# Patient Record
Sex: Male | Born: 1975 | Race: White | Hispanic: No | Marital: Married | Smoking: Never smoker
Health system: Southern US, Community
[De-identification: ages and names within clinical notes are randomized; demographics above are authoritative.]

## PROBLEM LIST (undated history)

## (undated) VITALS — BP 128/76 | HR 78 | Temp 98.4°F | Resp 20

## (undated) DIAGNOSIS — F32A Depression, unspecified: Secondary | ICD-10-CM

## (undated) DIAGNOSIS — Z87442 Personal history of urinary calculi: Secondary | ICD-10-CM

## (undated) DIAGNOSIS — Z973 Presence of spectacles and contact lenses: Secondary | ICD-10-CM

## (undated) DIAGNOSIS — I10 Essential (primary) hypertension: Secondary | ICD-10-CM

## (undated) DIAGNOSIS — D66 Hereditary factor VIII deficiency: Secondary | ICD-10-CM

## (undated) DIAGNOSIS — N200 Calculus of kidney: Secondary | ICD-10-CM

## (undated) DIAGNOSIS — S43006A Unspecified dislocation of unspecified shoulder joint, initial encounter: Secondary | ICD-10-CM

## (undated) DIAGNOSIS — E119 Type 2 diabetes mellitus without complications: Secondary | ICD-10-CM

## (undated) DIAGNOSIS — F419 Anxiety disorder, unspecified: Secondary | ICD-10-CM

## (undated) HISTORY — PX: HERNIA REPAIR: SHX51

## (undated) HISTORY — DX: Calculus of kidney: N20.0

---

## 2009-05-07 ENCOUNTER — Ambulatory Visit: Payer: Self-pay | Admitting: Diagnostic Radiology

## 2009-05-07 ENCOUNTER — Emergency Department (HOSPITAL_BASED_OUTPATIENT_CLINIC_OR_DEPARTMENT_OTHER): Admission: EM | Admit: 2009-05-07 | Discharge: 2009-05-07 | Payer: Self-pay | Admitting: Emergency Medicine

## 2009-05-11 ENCOUNTER — Ambulatory Visit: Payer: Self-pay | Admitting: Diagnostic Radiology

## 2009-05-11 ENCOUNTER — Emergency Department (HOSPITAL_BASED_OUTPATIENT_CLINIC_OR_DEPARTMENT_OTHER): Admission: EM | Admit: 2009-05-11 | Discharge: 2009-05-11 | Payer: Self-pay | Admitting: Emergency Medicine

## 2010-03-06 ENCOUNTER — Ambulatory Visit: Payer: Self-pay | Admitting: Diagnostic Radiology

## 2010-03-06 ENCOUNTER — Emergency Department (HOSPITAL_BASED_OUTPATIENT_CLINIC_OR_DEPARTMENT_OTHER): Admission: EM | Admit: 2010-03-06 | Discharge: 2010-03-06 | Payer: Self-pay | Admitting: Emergency Medicine

## 2010-04-07 ENCOUNTER — Emergency Department (HOSPITAL_BASED_OUTPATIENT_CLINIC_OR_DEPARTMENT_OTHER): Admission: EM | Admit: 2010-04-07 | Discharge: 2010-04-07 | Payer: Self-pay | Admitting: Emergency Medicine

## 2010-08-10 LAB — URINALYSIS, ROUTINE W REFLEX MICROSCOPIC
Ketones, ur: NEGATIVE mg/dL
Nitrite: NEGATIVE
Protein, ur: NEGATIVE mg/dL
Urobilinogen, UA: 1 mg/dL (ref 0.0–1.0)
pH: 7.5 (ref 5.0–8.0)

## 2010-08-12 LAB — URINALYSIS, ROUTINE W REFLEX MICROSCOPIC
Bilirubin Urine: NEGATIVE
Hgb urine dipstick: NEGATIVE
Ketones, ur: NEGATIVE mg/dL
Nitrite: NEGATIVE
Protein, ur: NEGATIVE mg/dL
Urobilinogen, UA: 1 mg/dL (ref 0.0–1.0)

## 2010-08-31 LAB — URINE MICROSCOPIC-ADD ON

## 2010-08-31 LAB — DIFFERENTIAL
Basophils Absolute: 0 10*3/uL (ref 0.0–0.1)
Basophils Relative: 1 % (ref 0–1)
Lymphocytes Relative: 32 % (ref 12–46)
Monocytes Absolute: 1 10*3/uL (ref 0.1–1.0)
Neutro Abs: 4.6 10*3/uL (ref 1.7–7.7)
Neutrophils Relative %: 52 % (ref 43–77)

## 2010-08-31 LAB — CBC
Hemoglobin: 14.2 g/dL (ref 13.0–17.0)
MCHC: 34.1 g/dL (ref 30.0–36.0)
RDW: 11.2 % — ABNORMAL LOW (ref 11.5–15.5)

## 2010-08-31 LAB — BASIC METABOLIC PANEL
CO2: 31 mEq/L (ref 19–32)
Calcium: 9.3 mg/dL (ref 8.4–10.5)
Creatinine, Ser: 1.1 mg/dL (ref 0.4–1.5)
GFR calc Af Amer: >60 mL/min (ref >60)
GFR calc non Af Amer: >60 mL/min (ref >60)
Glucose, Bld: 109 mg/dL — ABNORMAL HIGH (ref 70–99)
Sodium: 147 mEq/L — ABNORMAL HIGH (ref 135–145)

## 2010-08-31 LAB — URINALYSIS, ROUTINE W REFLEX MICROSCOPIC
Glucose, UA: NEGATIVE mg/dL
Nitrite: NEGATIVE
Protein, ur: 100 mg/dL — AB
Specific Gravity, Urine: 1.02 (ref 1.005–1.030)
Urobilinogen, UA: 0.2 mg/dL (ref 0.0–1.0)
Urobilinogen, UA: 1 mg/dL (ref 0.0–1.0)
pH: 5.5 (ref 5.0–8.0)

## 2010-08-31 LAB — URINE CULTURE: Colony Count: 8000

## 2010-12-18 ENCOUNTER — Emergency Department (HOSPITAL_BASED_OUTPATIENT_CLINIC_OR_DEPARTMENT_OTHER): Admission: EM | Admit: 2010-12-18 | Discharge: 2010-12-18 | Discharge status: Against Medical Advice | Payer: Self-pay

## 2010-12-23 ENCOUNTER — Emergency Department (HOSPITAL_BASED_OUTPATIENT_CLINIC_OR_DEPARTMENT_OTHER)
Admission: EM | Admit: 2010-12-23 | Discharge: 2010-12-23 | Disposition: A | Discharge status: Home / Self Care | Payer: Self-pay | Attending: Emergency Medicine | Admitting: Emergency Medicine

## 2010-12-23 ENCOUNTER — Encounter: Payer: Self-pay | Admitting: *Deleted

## 2010-12-23 DIAGNOSIS — IMO0001 Reserved for inherently not codable concepts without codable children: Secondary | ICD-10-CM | POA: Insufficient documentation

## 2010-12-23 DIAGNOSIS — H05229 Edema of unspecified orbit: Secondary | ICD-10-CM | POA: Insufficient documentation

## 2010-12-23 DIAGNOSIS — T7840XA Allergy, unspecified, initial encounter: Secondary | ICD-10-CM

## 2010-12-23 MED ORDER — PREDNISONE 10 MG PO TABS: 10.0000 mg | ORAL_TABLET | Freq: Two times a day (BID) | ORAL | Status: AC

## 2010-12-23 MED ORDER — FAMOTIDINE 10 MG PO TABS: ORAL_TABLET | ORAL | Status: DC

## 2010-12-23 MED ORDER — PREDNISONE 20 MG PO TABS
60.0000 mg | ORAL_TABLET | Freq: Once | ORAL | Status: AC
  Administered 2010-12-23: 60 mg via ORAL
  Filled 2010-12-23: qty 3

## 2010-12-23 MED ORDER — FAMOTIDINE 20 MG PO TABS
20.0000 mg | ORAL_TABLET | Freq: Once | ORAL | Status: AC
  Administered 2010-12-23: 20 mg via ORAL
  Filled 2010-12-23: qty 1

## 2010-12-23 NOTE — ED Notes (Signed)
Pt c/o eye swelling and redness around eyes x 1 day. Pt also c/o SOB

## 2010-12-23 NOTE — ED Provider Notes (Signed)
History     Chief Complaint  Patient presents with  . Allergic Reaction   HPI Pt woke this am w/ bilateral periorbital edema.  No vision changes.  No trauma.  No headache.  No known allergen and has not started any new medications.  Denies hives, throat tightness, tongue and lip edema.  Has an insect bite on left forearm that he noticed this am.  Had periorbital edema once in the past after being bitten by a spider.   History reviewed. No pertinent past medical history.  Past Surgical History  Procedure Date  . Hernia repair     History reviewed. No pertinent family history.  History  Substance Use Topics  . Smoking status: Never Smoker   . Smokeless tobacco: Not on file  . Alcohol Use: No      Review of Systems  All other systems reviewed and are negative.    Physical Exam  BP 147/94  Pulse 102  Temp(Src) 98.7 F (37.1 C) (Oral)  Resp 16  Wt 240 lb (108.863 kg)  SpO2 100%  Physical Exam  Nursing note and vitals reviewed. Constitutional: He is oriented to person, place, and time. He appears well-developed and well-nourished. No distress.  HENT:  Head: Normocephalic and atraumatic.  Mouth/Throat: Mucous membranes are normal. No posterior oropharyngeal edema.       No lip or tongue edema.  Eyes: Conjunctivae and EOM are normal. Pupils are equal, round, and reactive to light. Right eye exhibits no chemosis and no discharge.       Bilateral periorbital edema and mild erythema.  Non-tender.    Neck: Normal range of motion.  Cardiovascular: Normal rate and regular rhythm.   Pulmonary/Chest: Effort normal and breath sounds normal. No stridor. No respiratory distress.  Neurological: He is alert and oriented to person, place, and time.  Skin: Skin is warm and dry. No rash noted.       Macular, erythematous lesion on left medial forearm w/ mild induration and tiny bite marks in center.  Appears to be an inflamed insect bite.   Psychiatric: He has a normal mood and  affect. His behavior is normal.    ED Course  Procedures  MDM Pt woke w/ periorbital edema.  Has an insect bite on left forearm and had a similar reaction to spider bite in the past.  Symptoms have improved w/ benadryl at home.  Based on acute onset of symptoms, most likely has an allergic reaction.  No signs of respiratory compromise on exam. Received prednisone and pepcid in ED and discharged home w/ same.  Return precautions discussed.        Otilio Miu, [place] 12/23/10 [phone]

## 2010-12-23 NOTE — ED Provider Notes (Signed)
   Joaquin Bend and discussed his management with PAC [place].  I agree with the history, physical, assessment, and plan of care, with the following exceptions: None   Morad Tal Y.    Gavin Pound. Oletta Lamas, MD 12/23/10 [id number]

## 2011-01-22 NOTE — ED Provider Notes (Signed)
Evaluation and management procedures were performed by the mid-level provider (PA/NP/CNM) under my supervision/collaboration. I was present and available during the ED course. Imran Nuon Y.   Gavin Pound. Oletta Lamas, MD 01/22/11 2107

## 2011-04-23 IMAGING — CT CT ABD-PELV W/O CM
2 of 4 series · 17 of 46 positions shown, 19 images · non-contrast
Comparison: 05/07/2009.

CLINICAL DATA: Bilateral flank pain.

CT ABDOMEN AND PELVIS WITHOUT CONTRAST
TECHNIQUE: Multidetector CT imaging of the abdomen and pelvis was
performed following the standard protocol without intravenous
contrast.

[Series 2: renal stone < 200 lbs 5.0 b31f · axial · 0.77mm/px · z∈[-402,-32]mm · 14 of 82 slices shown, 16 images]
[im 4/82  soft-tissue]
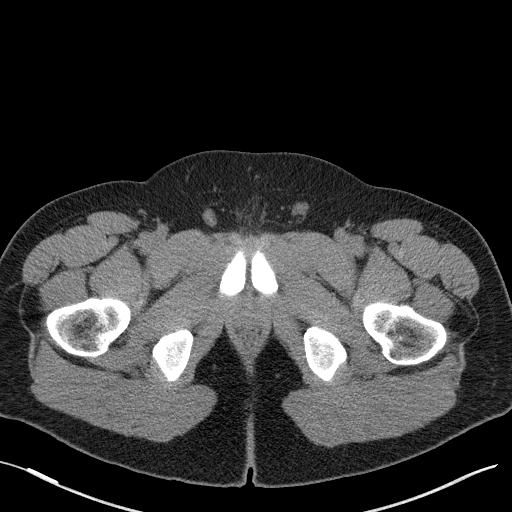
[im 4/82  bone]
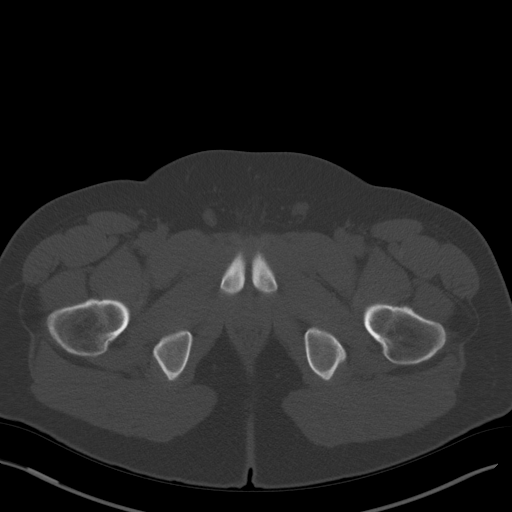
[im 10/82  soft-tissue]
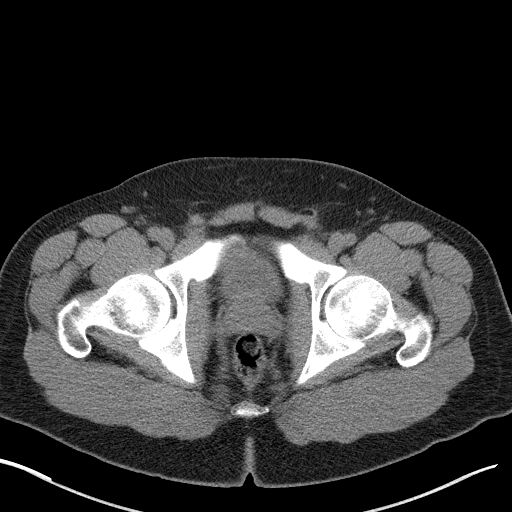
[im 16/82  soft-tissue]
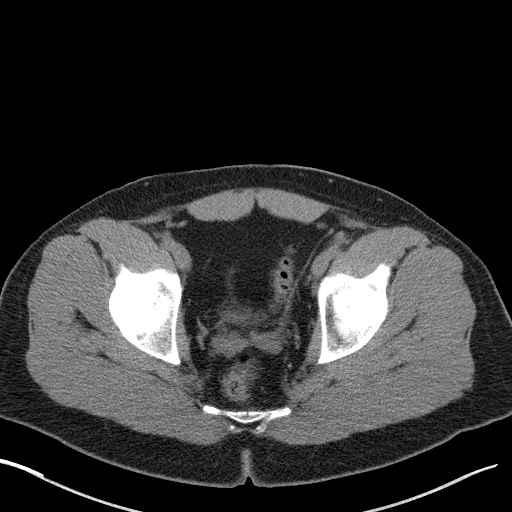
[im 22/82  soft-tissue]
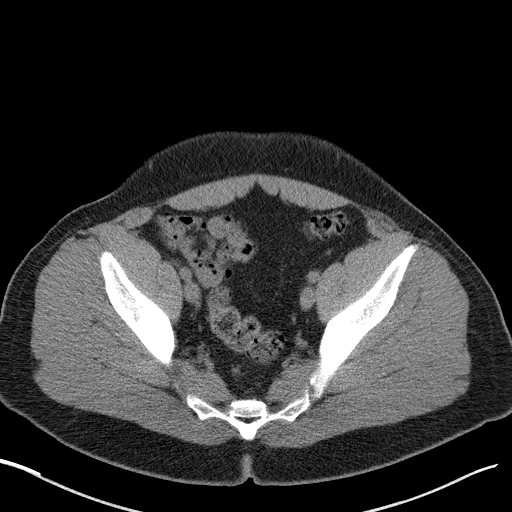
[im 29/82  soft-tissue]
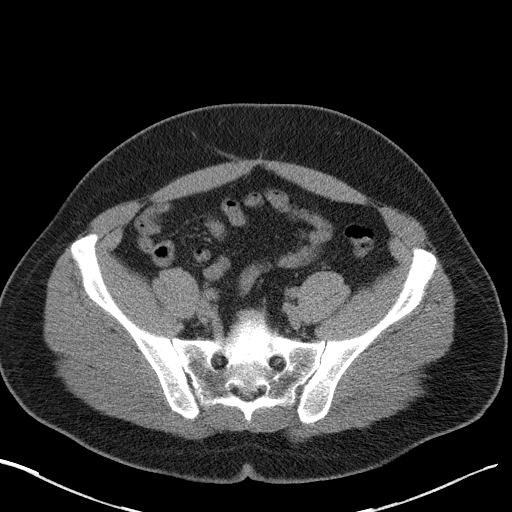
[im 32/82  soft-tissue]
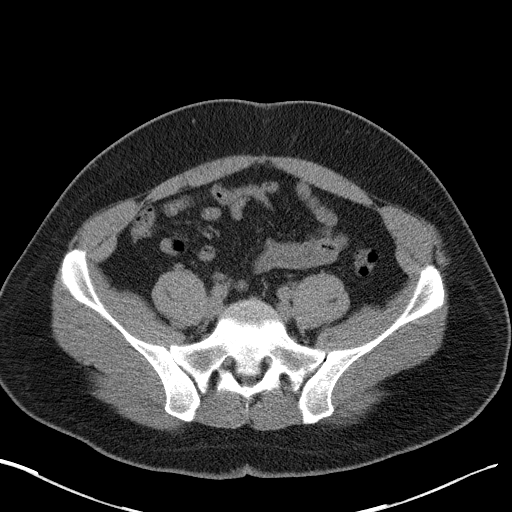
[im 38/82  soft-tissue]
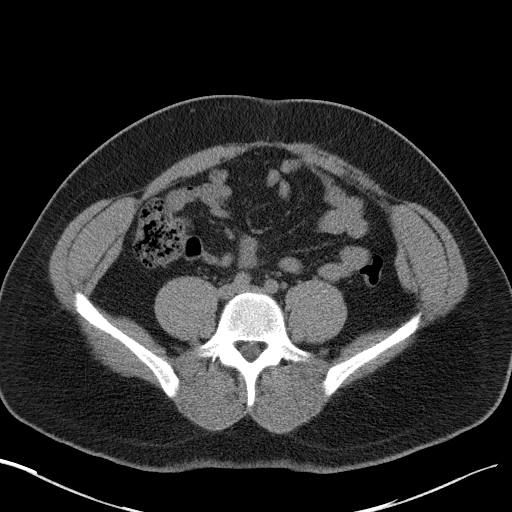
[im 44/82  soft-tissue]
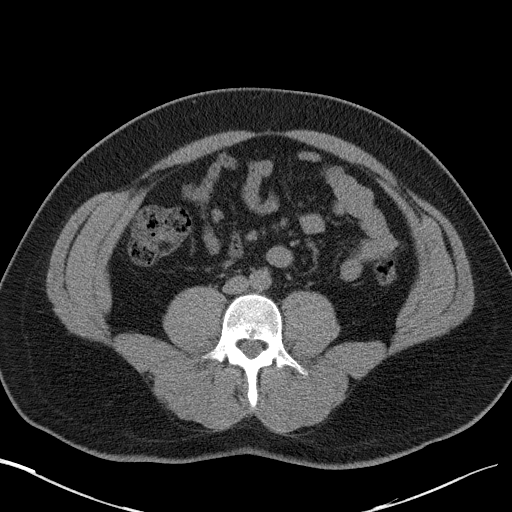
[im 50/82  soft-tissue]
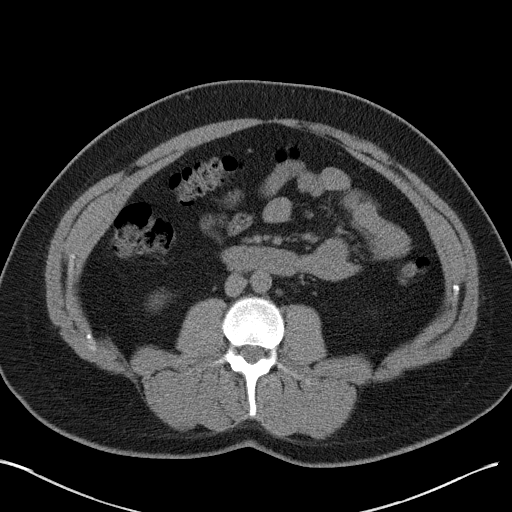
[im 50/82  bone]
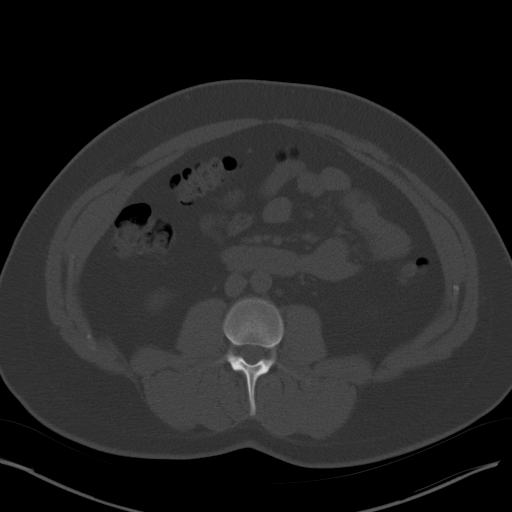
[im 53/82  soft-tissue]
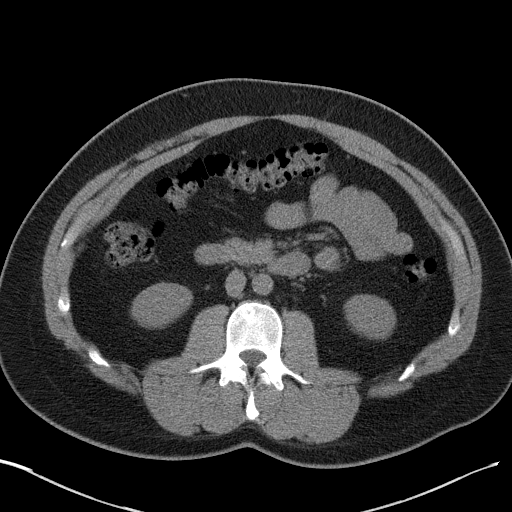
[im 60/82  soft-tissue]
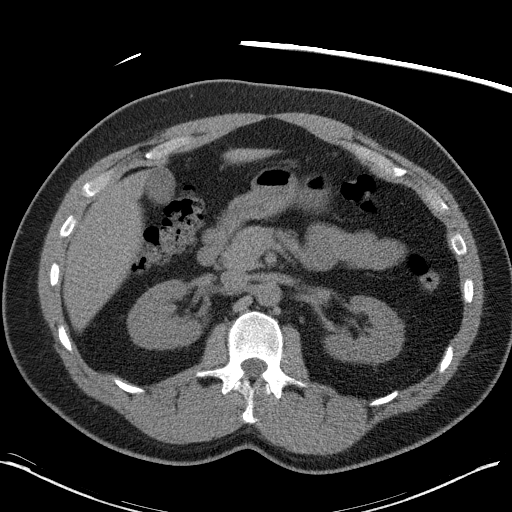
[im 66/82  soft-tissue]
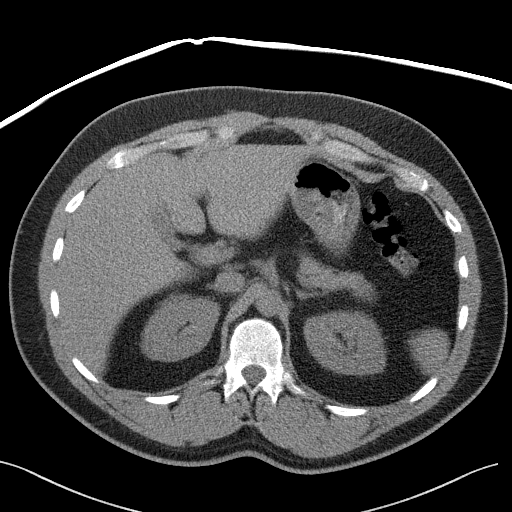
[im 72/82  soft-tissue]
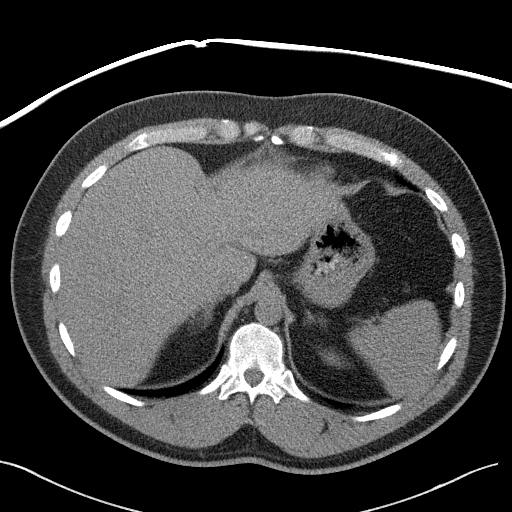
[im 78/82  soft-tissue]
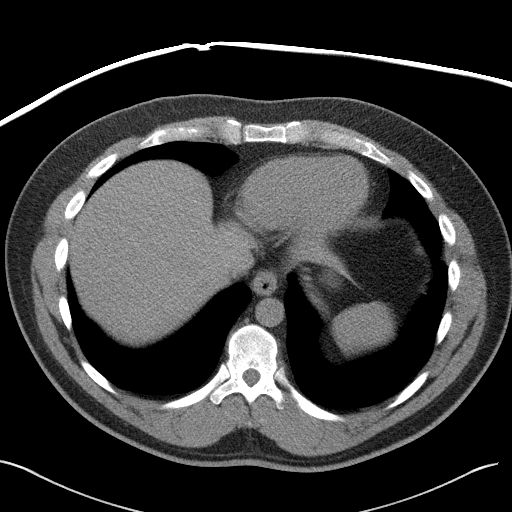

[Series 5: renal stone 3.0 coronal · coronal · 0.80mm/px · 3 of 100 slices shown]
[im 34/100  soft-tissue]
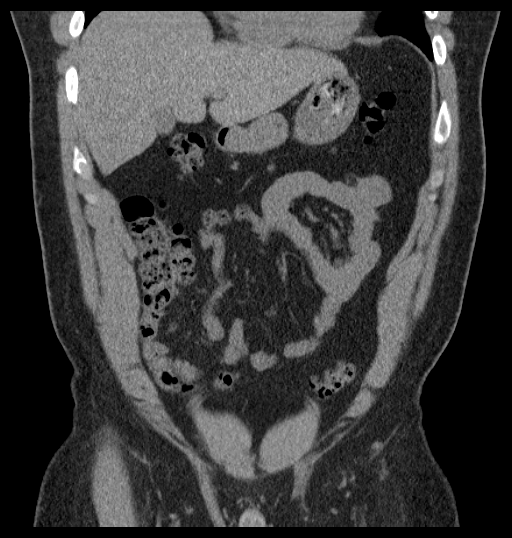
[im 45/100  soft-tissue]
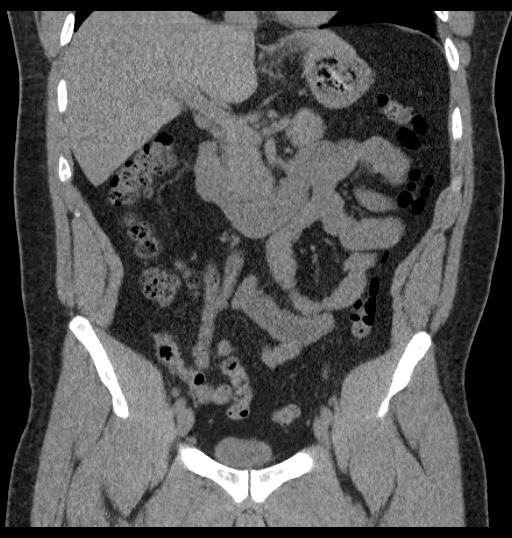
[im 56/100  soft-tissue]
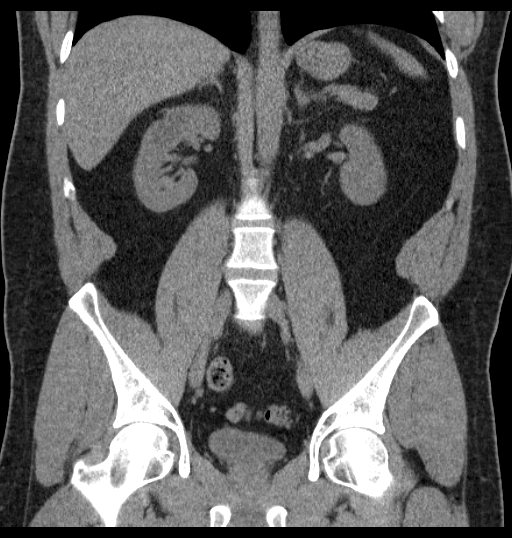

[17 of 46 positions shown; findings below may reference images not displayed]

FINDINGS: No focal abnormalities seen in the liver or spleen on
this study performed without intravenous contrast material.  The
stomach, duodenum, pancreas, gallbladder, adrenal glands, and left
kidney have normal imaging features.  2.3 cm subtle low density
region in the right kidney was present on the previous study and is
not appear substantially changed in the interval.  There is no
renal stone on either side.  No hydronephrosis.  No evidence for
ureteral or bladder calculi.

No abdominal aortic aneurysm.  There is no lymphadenopathy in the
abdomen.  No evidence for free fluid in the abdomen.

Imaging through the pelvis shows no evidence for pelvic
lymphadenopathy.  No free fluid is seen in the pelvis.  No
substantial divert jugular changes in the colon.  No colonic
diverticulitis.  The terminal ileum and the appendix are normal.

Bone windows reveal no worrisome lytic or sclerotic osseous
lesions.
IMPRESSION: No acute findings in the abdomen or pelvis to explain this
patient's history of bilateral flank pain.

2.3 cm subtle low density lesion in the right kidney does not
appear substantially changed since the prior study of almost 1 year
ago.  This is probably a renal cyst given the interval stability.
An area of parenchymal scarring could also generate this
appearance.

## 2011-04-26 ENCOUNTER — Encounter (HOSPITAL_BASED_OUTPATIENT_CLINIC_OR_DEPARTMENT_OTHER): Payer: Self-pay | Admitting: Emergency Medicine

## 2011-04-26 ENCOUNTER — Emergency Department (HOSPITAL_BASED_OUTPATIENT_CLINIC_OR_DEPARTMENT_OTHER)
Admission: EM | Admit: 2011-04-26 | Discharge: 2011-04-27 | Disposition: A | Discharge status: Home / Self Care | Payer: BC Managed Care – PPO | Attending: Emergency Medicine | Admitting: Emergency Medicine

## 2011-04-26 DIAGNOSIS — D66 Hereditary factor VIII deficiency: Secondary | ICD-10-CM | POA: Insufficient documentation

## 2011-04-26 DIAGNOSIS — K068 Other specified disorders of gingiva and edentulous alveolar ridge: Secondary | ICD-10-CM

## 2011-04-26 DIAGNOSIS — K055 Other periodontal diseases: Secondary | ICD-10-CM | POA: Insufficient documentation

## 2011-04-26 HISTORY — DX: Hereditary factor VIII deficiency: D66

## 2011-04-26 LAB — CBC
HCT: 40 % (ref 39.0–52.0)
MCH: 32.1 pg (ref 26.0–34.0)
MCHC: 33.5 g/dL (ref 30.0–36.0)
MCV: 95.9 fL (ref 78.0–100.0)
Platelets: 266 10*3/uL (ref 150–400)
RDW: 11.5 % (ref 11.5–15.5)
WBC: 9.8 10*3/uL (ref 4.0–10.5)

## 2011-04-26 LAB — PROTIME-INR: Prothrombin Time: 13.2 seconds (ref 11.6–15.2)

## 2011-04-26 LAB — DIFFERENTIAL
Basophils Absolute: 0 10*3/uL (ref 0.0–0.1)
Eosinophils Absolute: 0.2 10*3/uL (ref 0.0–0.7)
Eosinophils Relative: 2 % (ref 0–5)
Lymphocytes Relative: 20 % (ref 12–46)
Monocytes Absolute: 0.9 10*3/uL (ref 0.1–1.0)

## 2011-04-26 LAB — BASIC METABOLIC PANEL
CO2: 26 mEq/L (ref 19–32)
Calcium: 9.4 mg/dL (ref 8.4–10.5)
Creatinine, Ser: 0.9 mg/dL (ref 0.50–1.35)
GFR calc non Af Amer: >90 mL/min (ref >90)

## 2011-04-26 MED ORDER — HYDROCODONE-ACETAMINOPHEN 5-325 MG PO TABS
2.0000 | ORAL_TABLET | Freq: Once | ORAL | Status: AC
  Administered 2011-04-26: 2 via ORAL
  Filled 2011-04-26: qty 2

## 2011-04-26 MED ORDER — HYDROMORPHONE HCL PF 2 MG/ML IJ SOLN
2.0000 mg | Freq: Once | INTRAMUSCULAR | Status: AC
  Administered 2011-04-26: 2 mg via INTRAMUSCULAR
  Filled 2011-04-26: qty 1

## 2011-04-26 MED ORDER — ONDANSETRON HCL 4 MG/2ML IJ SOLN
4.0000 mg | Freq: Once | INTRAMUSCULAR | Status: AC
  Administered 2011-04-26: 4 mg via INTRAVENOUS

## 2011-04-26 MED ORDER — ONDANSETRON HCL 4 MG/2ML IJ SOLN
8.0000 mg | Freq: Once | INTRAMUSCULAR | Status: DC
  Filled 2011-04-26: qty 2

## 2011-04-26 MED ORDER — "THROMBI-PAD 3""X3"" EX PADS"
1.0000 | MEDICATED_PAD | Freq: Once | CUTANEOUS | Status: AC
  Administered 2011-04-26: 2 via TOPICAL
  Filled 2011-04-26 (×2): qty 1

## 2011-04-26 NOTE — ED Notes (Signed)
Report called to [employer] in charge in ED.  Was told by RN Aurther Loft in charge that she is backed up in ED and 20 people waiting.  RN Aurther Loft said to make [facility] Aware of the wait.  Pt. Has accepting physician  Dr. Ignacia Palma and hematologist also.  Will report to RN Elnita Maxwell. In charge at [facility] and to [facility].

## 2011-04-26 NOTE — ED Notes (Signed)
Family at bedside. 

## 2011-04-26 NOTE — ED Notes (Signed)
Pt reports having teeth cleaned at dentist this morning and has had bleeding from gums and pain since.

## 2011-04-26 NOTE — ED Notes (Signed)
Placed Pt. On 2 liters of oxygen for comfort due to mouth full of blood at times.  Pt. Using suction on his own.

## 2011-04-26 NOTE — ED Notes (Signed)
Pt is noted to be spitting blood tinged saliva. No blood clots noticed.

## 2011-04-26 NOTE — ED Notes (Signed)
No factor 8 in [facility] ED

## 2011-04-26 NOTE — ED Provider Notes (Signed)
History     CSN: [id number] Arrival date & time: 04/26/2011  7:41 PM   First MD Initiated Contact with Patient 04/26/11 1955      Chief Complaint  Patient presents with  . Dental Pain    (Consider location/radiation/quality/duration/timing/severity/associated sxs/prior treatment) HPI Comments: Patient has history hemophilia and had his teeth cleaned that today.  He said extensive gingival bleeding for the past 6 hours.  He believes it is sometimes scraping as well as cleaning. He's been spitting up blood into a basin.  He denies any vomiting, hemoptysis, chest pain, shortness of breath. Denies any difficulty breathing or swallowing.  The history is provided by the patient.    Past Medical History  Diagnosis Date  . Hemophilia     Past Surgical History  Procedure Date  . Hernia repair     No family history on file.  History  Substance Use Topics  . Smoking status: Never Smoker   . Smokeless tobacco: Not on file  . Alcohol Use: No      Review of Systems  All other systems reviewed and are negative.    Allergies  Review of patient's allergies indicates no known allergies.  Home Medications   Current Outpatient Rx  Name Route Sig Dispense Refill  . IBUPROFEN 200 MG PO TABS Oral Take 400 mg by mouth every 6 (six) hours as needed. For pain     . VITAMIN C 500 MG PO TABS Oral Take 500 mg by mouth daily.      Marland Kitchen FAMOTIDINE 10 MG PO TABS  One po bid x 3 days and then as needed 10 tablet 0    BP 126/102  Pulse 81  Temp(Src) 97.9 F (36.6 C) (Oral)  Resp 16  SpO2 100%  Physical Exam  Constitutional: He is oriented to person, place, and time. He appears well-developed and well-nourished. No distress.  HENT:  Head: Normocephalic and atraumatic.  Mouth/Throat: Oropharynx is clear and moist.       Oozing from all gingival surfaces without identifiable specific source.    Eyes: Conjunctivae are normal. Pupils are equal, round, and reactive to light.  Neck:  Normal range of motion.  Cardiovascular: Normal rate, regular rhythm and normal heart sounds.   Pulmonary/Chest: Effort normal and breath sounds normal. No respiratory distress.  Abdominal: Soft. There is no tenderness. There is no rebound and no guarding.  Musculoskeletal: Normal range of motion.  Neurological: He is alert and oriented to person, place, and time. No cranial nerve deficit.  Skin: Skin is warm.    ED Course  Procedures (including critical care time)  Labs Reviewed  CBC - Abnormal; Notable for the following:    RBC 4.17 (*)    All other components within normal limits  BASIC METABOLIC PANEL - Abnormal; Notable for the following:    Glucose, Bld 113 (*)    All other components within normal limits  DIFFERENTIAL  PROTIME-INR  FACTOR 8 ASSAY   No results found.   1. Hemophilia   2. Gingival bleeding       MDM  Gingival bleeding after dental procedure in patient with hemophilia.  Vital signs stable.  Factor VIII is not available from our pharmacy.  We'll attempt to control bleeding with thrombin pad.  Thrombigel is not controlling patient's bleeding.  He remains hemodynamically stable, has a stable hemoglobin and is protecting his airway.  I left a message for on call dentistry, Dr Renard Hamper.  D/w Dr. Renard Hamper who believes bleeding  will improve with factor VIII.  He would like hematologist to manage but will assist if needed.   I discussed case with Dr. Deretha Emory at [facility].  He agrees patient needs factor but it is uncertain whether he will need to be admitted to get it. Will discuss with hematology.  I discussed this case with Dr. Truett Perna of hematology.  He recommends replacing factor VIII to 50% activity and requests transfer to [facility] ED.  He may require input from the coagulation team at [facility].    Patient's airway and vital signs remain stable for transfer.  CRITICAL CARE Performed by: Glynn Octave   Total critical care time: 45  Critical care  time was exclusive of separately billable procedures and treating other patients.  Critical care was necessary to treat or prevent imminent or life-threatening deterioration.  Critical care was time spent personally by me on the following activities: development of treatment plan with patient and/or surrogate as well as nursing, discussions with consultants, evaluation of patient's response to treatment, examination of patient, obtaining history from patient or surrogate, ordering and performing treatments and interventions, ordering and review of laboratory studies, ordering and review of radiographic studies, pulse oximetry and re-evaluation of patient's condition.   Glynn Octave, MD 04/27/11 [phone]

## 2011-04-26 NOTE — ED Notes (Signed)
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 Presents after dental work with diffuse bleeding gums. Was seen at [facility] and aftercare discussed with hematologist Dr. Truett Perna was sent to [facility] long for further care.  Physical exam:  No acute distress, gums diffusely oozing, airway patent, lungs and heart with normal sounds, no rashes, conjunctiva clear  Assessment:  Hemophilia, mild per patient report but with gingival oozing requiring intervention. Sent from outside hospital for further treatment, after discussion with hematologist, the following recommendations have been made:   [facility] Hemophilia service - DDAVP - 0.3 mcg / kg sub Q, vs Amicar solution - 25% solution - 50mg  / kg - swish and swallow.  Factlor 8 Level  Swirl swallow q 6 hours - if works, do for a week.  Can call Sherill back overnight as needed.  0500 - Dr. Myrle Sheng repaged to see pt - has seen and d/w Dr. Marcheta Grammes at [facility] - recommends dose of recombinant factor 8 - I have ordered this.  Then do the Amicar solution q 6 hours X 1 week.  Dr. Myrle Sheng will recheck in a couple of hours.  Recommends against admission to this hospital but states that may need transfer if factor 8 doesn't work.  Change of shift - care signed out to Dr. Patrica Duel.  Vida Roller, MD 04/27/11 [phone]

## 2011-04-27 DIAGNOSIS — D66 Hereditary factor VIII deficiency: Secondary | ICD-10-CM

## 2011-04-27 DIAGNOSIS — K1370 Unspecified lesions of oral mucosa: Secondary | ICD-10-CM

## 2011-04-27 LAB — CBC
HCT: 36.3 % — ABNORMAL LOW (ref 39.0–52.0)
MCH: 32.1 pg (ref 26.0–34.0)
MCV: 99.5 fL (ref 78.0–100.0)
RBC: 3.65 MIL/uL — ABNORMAL LOW (ref 4.22–5.81)
RDW: 12.1 % (ref 11.5–15.5)
WBC: 12 10*3/uL — ABNORMAL HIGH (ref 4.0–10.5)

## 2011-04-27 MED ORDER — DESMOPRESSIN ACETATE 4 MCG/ML IJ SOLN: 0.3000 ug/kg | Freq: Once | INTRAMUSCULAR | Status: DC

## 2011-04-27 MED ORDER — AMINOCAPROIC ACID 25 % PO SYRP
50.0000 mg/kg | ORAL_SOLUTION | Freq: Four times a day (QID) | ORAL | Status: DC
  Administered 2011-04-27 (×3): 5450 mg via ORAL
  Filled 2011-04-27 (×7): qty 21.8

## 2011-04-27 MED ORDER — AMINOCAPROIC ACID 25 % PO SYRP: ORAL_SOLUTION | ORAL | Status: DC

## 2011-04-27 MED ORDER — SODIUM CHLORIDE 0.9 % IV SOLN
0.3000 ug/kg | Freq: Once | INTRAVENOUS | Status: AC
  Administered 2011-04-27: 32.8 ug via INTRAVENOUS
  Filled 2011-04-27: qty 8.2

## 2011-04-27 MED ORDER — OXYCODONE-ACETAMINOPHEN 5-325 MG PO TABS: 1.0000 | ORAL_TABLET | Freq: Four times a day (QID) | ORAL | Status: AC | PRN

## 2011-04-27 MED ORDER — ANTIHEM FACTOR RECOMB (RFVIII) 500 UNITS IV KIT
2680.0000 [IU] | PACK | Freq: Once | INTRAVENOUS | Status: AC
  Administered 2011-04-27: 2680 [IU] via INTRAVENOUS
  Filled 2011-04-27: qty 2680

## 2011-04-27 MED ORDER — FAMOTIDINE IN NACL 20-0.9 MG/50ML-% IV SOLN
20.0000 mg | Freq: Once | INTRAVENOUS | Status: AC
  Administered 2011-04-27: 20 mg via INTRAVENOUS
  Filled 2011-04-27: qty 50

## 2011-04-27 MED ORDER — HYDROMORPHONE HCL PF 2 MG/ML IJ SOLN
INTRAMUSCULAR | Status: AC
  Administered 2011-04-27: 2 mg
  Filled 2011-04-27: qty 1

## 2011-04-27 MED ORDER — ANTIHEM FACTOR RECOMB (RFVIII) 500 UNITS IV KIT: 25.0000 [IU]/kg | PACK | INTRAVENOUS | Status: DC

## 2011-04-27 MED ORDER — HYDROMORPHONE HCL PF 1 MG/ML IJ SOLN
1.0000 mg | INTRAMUSCULAR | Status: DC | PRN
  Administered 2011-04-27 (×2): 1 mg via INTRAVENOUS
  Filled 2011-04-27 (×2): qty 1

## 2011-04-27 MED ORDER — HYDROMORPHONE HCL PF 1 MG/ML IJ SOLN
1.0000 mg | Freq: Once | INTRAMUSCULAR | Status: AC
  Administered 2011-04-27: 1 mg via INTRAVENOUS
  Filled 2011-04-27: qty 1

## 2011-04-27 NOTE — ED Notes (Signed)
Talked with Dr Ross Marcus, update given to EDPA and pt.

## 2011-04-27 NOTE — ED Notes (Signed)
[employer] Given

## 2011-04-27 NOTE — ED Notes (Signed)
Rec'd patient from [facility] for evaluation by hematologist for excessive bleeding from gums and oral mucosa. Patient states that he started bleeding from his mouth and gums after having a routine dental cleaning.

## 2011-04-27 NOTE — ED Notes (Signed)
Truett Perna, MD at bedside.

## 2011-04-27 NOTE — ED Notes (Signed)
Patient states he feels like he has a decrease in the amount of bleeding to his oral cavity. Oral cavity remains bright red with scant clotting noted. Patient states he has had to use the suction less frequently.

## 2011-04-27 NOTE — ED Notes (Signed)
Suction cannister changed

## 2011-04-27 NOTE — Progress Notes (Signed)
New Hematology/Oncology Consult   Referral MD: Miller/Rancour    Reason for Referral: Hemophilia A. with bleeding following a dental procedure  HPI: Mr. Ribas reports being diagnosed with hemophilia A. at age 35 when he underwent a hernia repair. He states the factor VIII level was initially measured at 13%. He was diagnosed with mild hemophilia A. He reports infrequent treatment for hemophilia throughout his life. He estimates a total of 12 episodes of bleeding requiring treatment.The most notable episode was when he "dislocated" his right shoulder.  On November 27 she underwent a dental cleaning procedure. He reports receiving anesthetic injections and topical anesthetics prior to the procedure. Beginning at approximately 11:30 AM on November 27 he noted the onset of diffuse oozing from the gums. [facility] He was transferred to the [facility] emergency room for further management. He has received Amicar rinse and DDAVP during the night. The gum oozing persist. He also reports pain in the mouth. He otherwise feels well. Topical treatment withTthrombigel did not help.    Past Medical History  Diagnosis Date  . Hemophilia   :  Past Surgical History  Procedure Date  . Hernia repair   :  Current facility-administered medications:aminocaproic acid (AMICAR) 25 % solution 5,450 mg, 50 mg/kg, Oral, Q6H, Vida Roller, MD, 5,450 mg at 04/27/11 [phone];  desmopressin (DDAVP) 32.8 mcg in sodium chloride 0.9 % 50 mL IVPB, 0.3 mcg/kg, Intravenous, Once, Vida Roller, MD, 32.8 mcg at 04/27/11 0122;  HYDROcodone-acetaminophen (NORCO) 5-325 MG per tablet 2 tablet, 2 tablet, Oral, Once, Glynn Octave, MD, 2 tablet at 04/26/11 2026 HYDROmorphone (DILAUDID) 2 MG/ML injection, , , , , 2 mg at 04/27/11 0119;  HYDROmorphone (DILAUDID) injection 1 mg, 1 mg, Intravenous, Q4H PRN, Vida Roller, MD, 1 mg at 04/27/11 0456;  HYDROmorphone (DILAUDID)  injection 2 mg, 2 mg, Intramuscular, Once, Glynn Octave, MD, 2 mg at 04/26/11 2129;  ondansetron [facility]) injection 4 mg, 4 mg, Intravenous, Once, Glynn Octave, MD, 4 mg at 04/26/11 2127 Thrombi-Pad [facility]) 3"X3" pad 1 each, 1 each, Topical, Once, Glynn Octave, MD, 2 each at 04/26/11 2027;  DISCONTD: desmopressin (DDAVP) injection 0.3 mcg/kg, 0.3 mcg/kg, Subcutaneous, Once, Vida Roller, MD;  DISCONTD: ondansetron [facility]) injection 8 mg, 8 mg, Intramuscular, Once, Glynn Octave, MD Current outpatient prescriptions:ibuprofen (ADVIL,MOTRIN) 200 MG tablet, Take 400 mg by mouth every 6 (six) hours as needed. For pain , Disp: , Rfl: ;  vitamin C (ASCORBIC ACID) 500 MG tablet, Take 500 mg by mouth daily.  , Disp: , Rfl: ;  famotidine (PEPCID AC) 10 MG tablet, One po bid x 3 days and then as needed, Disp: 10 tablet, Rfl: 0:     . aminocaproic acid  50 mg/kg Oral Q6H  . desmopressin (DDAVP) IV  0.3 mcg/kg Intravenous Once  . HYDROcodone-acetaminophen  2 tablet Oral Once  . HYDROmorphone      .  HYDROmorphone (DILAUDID) injection  2 mg Intramuscular Once  . ondansetron (ZOFRAN) IV  4 mg Intravenous Once  . Thrombi-Pad  1 each Topical Once  . DISCONTD: desmopressin  0.3 mcg/kg Subcutaneous Once  . DISCONTD: ondansetron  8 mg Intramuscular Once  :  No Known Allergies:  No family history on file.:  History   Social History  . Marital Status: Married    Spouse Name: N/A    Number of Children: 5  . Years of Education: N/A   Occupational History  .  he works in Therapist, sports in [employer]   Social History Main Topics  . Smoking status: Never Smoker   . Smokeless tobacco: Not on file  . Alcohol Use: No  . Drug Use: No  . Sexually Active: No   Other Topics Concern  . Not on file   Social History Narrative  .  he lives in [place]. He denies respected for HIV and hepatitis.   :  ROS: Remarkable for bleeding at the lower gum line and pain in the mouth. The review of  systems is otherwise negative   Exam: Patient Vitals for the past 24 hrs:  BP Temp Temp src Pulse Resp SpO2 Weight  04/27/11 0351 - 98.5 F (36.9 C) Oral - - - -  04/27/11 0338 124/64 mmHg - - 98  18  97 % -  04/27/11 0021 - - - - - - 240 lb (108.863 kg)  04/27/11 0021 - - - - - - 240 lb (108.863 kg)  04/27/11 0004 126/102 mmHg - - 81  16  100 % -  04/26/11 2328 143/89 mmHg - - 73  16  100 % -  04/26/11 2132 127/75 mmHg - - 77  - 96 % -  04/26/11 1912 142/95 mmHg 97.9 F (36.6 C) Oral 94  18  99 % -    Physical examination: He is alert.  HEENT: There is diffuse oozing of bright red blood from the gums. This is most prominent at the bilateral lower gum line. There is a small amount of fresh blood over the tongue and pharynx. No thrush.  Cardiac: Regular rate and rhythm  Lungs: Good air movement bilaterally. Mild in expiratory wheeze at the posterior chest. No respiratory distress  Vascular: No leg edema    Basename 04/26/11 2020  WBC 9.8  HGB 13.4  HCT 40.0  PLT 266    Basename 04/26/11 2020  NA 140  K 4.4  CL 106  CO2 26  GLUCOSE 113*  BUN 22  CREATININE 0.90  CALCIUM 9.4    PT 13.2, PTT 48    No results found.  Assessment and Plan:  1. 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  2. Mouth bleeding following a dental procedure on November 27-persistent following DDAVP, a topical thrombin preparation, and Amicar rinse.  Recommendations:  1. Continue the Amicar swirl and swallow every 6 hours 2. Factor VIII replacement    Mr. Pop continues to have mouth bleeding despite DDAVP and Amicar rinse. A factor VIII level was drawn during the night but will not be available for at least several more hours. I discussed the case with the hemophilia service at [facility] last night and again this morning. They agree with the plan for factor VIII administration and recommend a 25 units per kilogram dose with continuation of Amicar. We will administer  the factor VIII dose this morning and monitor him for clinical improvement over the next several hours. I will recommend he establish care with a physician at the [facility] hemophilia center.    Farouk Vivero BRADLEY,MD  04/27/2011, 6:46 AM

## 2011-04-27 NOTE — ED Provider Notes (Signed)
8:51 AM Are assumed from Dr. Hyacinth Meeker. Patient with a known history of hemophilia A. Patient presented after dental work with bleeding from the gums. He was given DDAVP subcutaneous as well as chimeric R. solution. Patient has also been been given a dose of recombinant factor VIII as recommended by Dr. Truett Perna who has seen the patient in the ER.  The plan is for Dr. Truett Perna to reassess the patient. This morning. Also, awaiting repeat CBC.  Patient remained stable, airway is patent. Will follow recommendations from our hematologist, Dr. Truett Perna for further care and evaluation.   Results for orders placed during the hospital encounter of 04/26/11  CBC      Component Value Range   WBC 9.8  4.0 - 10.5 (K/uL)   RBC 4.17 (*) 4.22 - 5.81 (MIL/uL)   Hemoglobin 13.4  13.0 - 17.0 (g/dL)   HCT 45.4  09.8 - 11.9 (%)   MCV 95.9  78.0 - 100.0 (fL)   MCH 32.1  26.0 - 34.0 (pg)   MCHC 33.5  30.0 - 36.0 (g/dL)   RDW 14.7  82.9 - 56.2 (%)   Platelets 266  150 - 400 (K/uL)  DIFFERENTIAL      Component Value Range   Neutrophils Relative 68  43 - 77 (%)   Neutro Abs 6.6  1.7 - 7.7 (K/uL)   Lymphocytes Relative 20  12 - 46 (%)   Lymphs Abs 2.0  0.7 - 4.0 (K/uL)   Monocytes Relative 10  3 - 12 (%)   Monocytes Absolute 0.9  0.1 - 1.0 (K/uL)   Eosinophils Relative 2  0 - 5 (%)   Eosinophils Absolute 0.2  0.0 - 0.7 (K/uL)   Basophils Relative 0  0 - 1 (%)   Basophils Absolute 0.0  0.0 - 0.1 (K/uL)  BASIC METABOLIC PANEL      Component Value Range   Sodium 140  135 - 145 (mEq/L)   Potassium 4.4  3.5 - 5.1 (mEq/L)   Chloride 106  96 - 112 (mEq/L)   CO2 26  19 - 32 (mEq/L)   Glucose, Bld 113 (*) 70 - 99 (mg/dL)   BUN 22  6 - 23 (mg/dL)   Creatinine, Ser 1.30  0.50 - 1.35 (mg/dL)   Calcium 9.4  8.4 - 86.5 (mg/dL)   GFR calc non Af Amer >90  >90 (mL/min)   GFR calc Af Amer >90  >90 (mL/min)  PROTIME-INR      Component Value Range   Prothrombin Time 13.2  11.6 - 15.2 (seconds)   INR 0.98  0.00 - 1.49    APTT      Component Value Range   aPTT 48 (*) 24 - 37 (seconds)  CBC      Component Value Range   WBC 12.0 (*) 4.0 - 10.5 (K/uL)   RBC 3.65 (*) 4.22 - 5.81 (MIL/uL)   Hemoglobin 11.7 (*) 13.0 - 17.0 (g/dL)   HCT 78.4 (*) 69.6 - 52.0 (%)   MCV 99.5  78.0 - 100.0 (fL)   MCH 32.1  26.0 - 34.0 (pg)   MCHC 32.2  30.0 - 36.0 (g/dL)   RDW 29.5  28.4 - 13.2 (%)   Platelets 276  150 - 400 (K/uL)   No results found.   11:44 AM Still awaiting callback from the oncologist, Dr. Truett Perna. Patient states he is improving, but complains of continued pain and indigestion. He is given IV medications for his current symptoms, and we'll discuss the  final disposition with Dr. Truett Perna   1:41 PM The patient remains very stable. Disposition discussed with Dr. Truett Perna who also recommended outpatient followup. Patient fact the patient has been improving significantly after his infusion of recombinant factor. A, as well as the swish and swallow, Amicar solution. Patient feels better and is amenable to going home. He understands the importance of following up with his hematologist or returning to the ED for any concerns or changing symptoms.  Final impression #1 hemophilia A. #2 gingival bleeding. #3 mild anemia  Zierra Laroque A. Patrica Duel, MD 04/27/11 1341

## 2011-05-02 ENCOUNTER — Emergency Department (HOSPITAL_COMMUNITY)
Admission: EM | Admit: 2011-05-02 | Discharge: 2011-05-02 | Disposition: A | Discharge status: Home / Self Care | Payer: BC Managed Care – PPO | Attending: Emergency Medicine | Admitting: Emergency Medicine

## 2011-05-02 ENCOUNTER — Encounter (HOSPITAL_COMMUNITY): Payer: Self-pay | Admitting: Emergency Medicine

## 2011-05-02 DIAGNOSIS — R5381 Other malaise: Secondary | ICD-10-CM | POA: Insufficient documentation

## 2011-05-02 DIAGNOSIS — R42 Dizziness and giddiness: Secondary | ICD-10-CM | POA: Insufficient documentation

## 2011-05-02 DIAGNOSIS — K068 Other specified disorders of gingiva and edentulous alveolar ridge: Secondary | ICD-10-CM

## 2011-05-02 DIAGNOSIS — R58 Hemorrhage, not elsewhere classified: Secondary | ICD-10-CM | POA: Insufficient documentation

## 2011-05-02 DIAGNOSIS — D66 Hereditary factor VIII deficiency: Secondary | ICD-10-CM | POA: Insufficient documentation

## 2011-05-02 DIAGNOSIS — K055 Other periodontal diseases: Secondary | ICD-10-CM | POA: Insufficient documentation

## 2011-05-02 DIAGNOSIS — R5383 Other fatigue: Secondary | ICD-10-CM | POA: Insufficient documentation

## 2011-05-02 LAB — CBC
HCT: 35.7 % — ABNORMAL LOW (ref 39.0–52.0)
Hemoglobin: 12.1 g/dL — ABNORMAL LOW (ref 13.0–17.0)
MCH: 32.1 pg (ref 26.0–34.0)
MCHC: 33.9 g/dL (ref 30.0–36.0)

## 2011-05-02 MED ORDER — OXYCODONE-ACETAMINOPHEN 5-325 MG PO TABS
2.0000 | ORAL_TABLET | Freq: Four times a day (QID) | ORAL | Status: DC | PRN
  Administered 2011-05-02: 2 via ORAL
  Filled 2011-05-02: qty 2

## 2011-05-02 MED ORDER — ANTIHEM FACTOR RECOMB (RFVIII) 500 UNITS IV KIT
2700.0000 [IU] | PACK | Freq: Once | INTRAVENOUS | Status: DC
  Filled 2011-05-02: qty 2700

## 2011-05-02 MED ORDER — ANTIHEM FACTOR RECOMB (RFVIII) 500 UNITS IV KIT
2980.0000 [IU] | PACK | INTRAVENOUS | Status: AC
  Administered 2011-05-02: 2980 [IU] via INTRAVENOUS
  Filled 2011-05-02: qty 2980

## 2011-05-02 MED ORDER — SODIUM CHLORIDE 0.9 % IV SOLN
Freq: Once | INTRAVENOUS | Status: AC
  Administered 2011-05-02: 11:00:00 via INTRAVENOUS

## 2011-05-02 MED ORDER — AMINOCAPROIC ACID SOLUTION 5% (50 MG/ML)
5.0000 mL | Freq: Four times a day (QID) | ORAL | Status: DC
  Administered 2011-05-02: 5 mL via ORAL
  Filled 2011-05-02 (×2): qty 100

## 2011-05-02 MED ORDER — FENTANYL CITRATE 0.05 MG/ML IJ SOLN
50.0000 ug | Freq: Once | INTRAMUSCULAR | Status: AC
  Administered 2011-05-02: 50 ug via INTRAVENOUS
  Filled 2011-05-02: qty 2

## 2011-05-02 NOTE — ED Notes (Signed)
IV therapy called to administer Factor VIII

## 2011-05-02 NOTE — ED Notes (Signed)
Pt states "I came here last week last Tuesday and got Factor transfused  for the same bleeding, it feels like my gums are on fire"

## 2011-05-02 NOTE — ED Provider Notes (Signed)
I saw and evaluated the patient, reviewed the resident's note and I agree with the findings and plan.  Patient feels much better at this time.  His bleeding has resolved.  He received his factor VIII in the emergency department and his swish and spit with the Amicar.  He reports all the symptoms have resolved and he would like to go home at this time.  Given his bleeding is resolved will refer him back to his hematologist in [place].   Results for orders placed during the hospital encounter of 05/02/11  CBC      Component Value Range   WBC 5.9  4.0 - 10.5 (K/uL)   RBC 3.77 (*) 4.22 - 5.81 (MIL/uL)   Hemoglobin 12.1 (*) 13.0 - 17.0 (g/dL)   HCT 16.1 (*) 09.6 - 52.0 (%)   MCV 94.7  78.0 - 100.0 (fL)   MCH 32.1  26.0 - 34.0 (pg)   MCHC 33.9  30.0 - 36.0 (g/dL)   RDW 04.5  40.9 - 81.1 (%)   Platelets 279  150 - 400 (K/uL)     1. Gingival bleeding   2. Hemophilia      Lyanne Co, MD 05/02/11 1252

## 2011-05-02 NOTE — ED Notes (Signed)
Pt given discharge instructions and verbalizes understanding  

## 2011-05-02 NOTE — ED Provider Notes (Signed)
History     CSN: [id number] Arrival date & time: 05/02/2011  8:36 AM   First MD Initiated Contact with Patient 05/02/11 [phone]      Chief Complaint  Patient presents with  . Coagulation Disorder    (Consider location/radiation/quality/duration/timing/severity/associated sxs/prior treatment) HPI CC: bleeding in mouth  Caleb Mckinney is a 35 year old man with PMH of hemophilia A, who presents for bleeding in his mouth since 6AM yesterday. Caleb Mckinney states that he got his teeth cleaned on Tuesday Nov 27 and had persistent bleeding afterwards. He states that he came to the [facility] ED on the 27th and received factor 8 at that time.  He was told to fill prescription for Amacar swish and swallow, but was unable to do so at his pharmacy.  He states he was still bleeding a small amount after this discharge, but this increased in severity yesterday morning. He states she came to the ED because he was spitting up a lot of blood and clots.  He cannot think of an inciting event that caused his bleeding to get worse. He denies any trauma or hard foods.  He states that he is feeling tired and a little lightheaded. H is having a burning pain in his lower jaw that began with the increased bleeding. It is currently 6/10 in severity and is worse with rinsing his mouth out. The pain is not relieved with percocet or ibuprofen.  He Denies any swollen joints, joint pain or leg or abdominal swelling.   He states he has needed red cell transfusions x 2 in the past and "over fifty" infusions of factor VIII.       Past Medical History  Diagnosis Date  . Hemophilia     Past Surgical History  Procedure Date  . Hernia repair     No family history on file.  History  Substance Use Topics  . Smoking status: Never Smoker   . Smokeless tobacco: Not on file  . Alcohol Use: No      Review of Systems  Constitutional: Positive for fatigue. Negative for fever, chills, diaphoresis and appetite change.  HENT: Negative  for nosebleeds, facial swelling and neck pain.   Respiratory: Negative for cough, chest tightness, shortness of breath and wheezing.   Cardiovascular: Negative for chest pain, palpitations and leg swelling.  Genitourinary: Negative for hematuria and difficulty urinating.  Musculoskeletal: Negative for back pain, joint swelling and arthralgias.  Neurological: Positive for light-headedness. Negative for dizziness and headaches.    Allergies  Review of patient's allergies indicates no known allergies.  Home Medications   Current Outpatient Rx  Name Route Sig Dispense Refill  . IBUPROFEN 200 MG PO TABS Oral Take 400 mg by mouth every 6 (six) hours as needed. For pain     . OXYCODONE-ACETAMINOPHEN 5-325 MG PO TABS Oral Take 1 tablet by mouth every 6 (six) hours as needed for pain. 15 tablet 0    BP 134/72  Pulse 80  Temp(Src) 98.9 F (37.2 C) (Oral)  Resp 18  Wt 240 lb (108.863 kg)  SpO2 100%  Physical Exam  Constitutional: He is oriented to person, place, and time. He appears well-developed and well-nourished. No distress.  HENT:  Head: Normocephalic and atraumatic.  Nose: No rhinorrhea. No epistaxis.  Mouth/Throat:         Bleeding around molar. Minimal blood in the mouth. No blood clots.   Eyes: EOM are normal. Pupils are equal, round, and reactive to light.  Cardiovascular: Normal  rate, regular rhythm, normal heart sounds and intact distal pulses.   Pulmonary/Chest: Effort normal and breath sounds normal. No respiratory distress. He has no wheezes. He has no rales.  Abdominal: Soft. Bowel sounds are normal. He exhibits no distension. There is no tenderness. There is no rebound and no guarding.  Musculoskeletal: He exhibits no tenderness.  Neurological: He is oriented to person, place, and time. No cranial nerve deficit.  Skin: Skin is warm and dry. No rash noted. He is not diaphoretic. No erythema. No pallor.  Psychiatric: He has a normal mood and affect. His behavior is  normal. Judgment and thought content normal.    ED Course  Procedures (including critical care time)  Labs Reviewed  CBC - Abnormal; Notable for the following:    RBC 3.77 (*)    Hemoglobin 12.1 (*)    HCT 35.7 (*)    All other components within normal limits   No results found.   No diagnosis found.    MDM  Man with known hemophilia A presents with recurrent bleed.  Will check CBC for anemia given lightheadedness and weakness.  Will give recombinant factor VIII at 25 units/kg (dose given last time). Will eval further after dose of factor VIII and Amacar.  Dr. Truett Perna called and stated would see pt if bleeding not improved with amacar and factor VIII. Bleeding did stop with the above interventions. Pt discharged with follow up with Dr. Truett Perna in the transfusion center for further events and the express directive to go establish care at the [facility] ASAP.        Quentin Ore, MD 05/02/11 [phone]

## 2011-06-27 ENCOUNTER — Telehealth: Payer: Self-pay | Admitting: Oncology

## 2011-06-27 NOTE — Telephone Encounter (Signed)
called pts home lmovm to rtn call to confirm appt for 09/05/2011 for hospital f/u

## 2011-06-29 ENCOUNTER — Telehealth: Payer: Self-pay | Admitting: Oncology

## 2011-06-29 NOTE — Telephone Encounter (Signed)
called pts lmovm to rtn call to schedule appt for 09/05/2011

## 2011-06-30 ENCOUNTER — Telehealth: Payer: Self-pay | Admitting: Oncology

## 2011-06-30 NOTE — Telephone Encounter (Signed)
called pt no response for hosptial f/u for April.  will fax over a letter to Dr. Manus Gunning to informed them that we were unable to contact pt for appt

## 2011-07-01 ENCOUNTER — Telehealth: Payer: Self-pay | Admitting: Oncology

## 2011-07-01 NOTE — Telephone Encounter (Signed)
Not able to reach patient with follow-up appt will inform Dr. Truett Perna

## 2011-07-01 NOTE — Telephone Encounter (Signed)
no response from pt gv referral back to( HIM)

## 2011-09-05 ENCOUNTER — Other Ambulatory Visit: Payer: BC Managed Care – PPO | Admitting: Lab

## 2011-09-05 ENCOUNTER — Inpatient Hospital Stay: Payer: BC Managed Care – PPO | Admitting: Oncology

## 2011-09-05 ENCOUNTER — Ambulatory Visit: Payer: BC Managed Care – PPO

## 2014-08-14 ENCOUNTER — Encounter (HOSPITAL_BASED_OUTPATIENT_CLINIC_OR_DEPARTMENT_OTHER): Payer: Self-pay | Admitting: *Deleted

## 2014-08-14 DIAGNOSIS — Z862 Personal history of diseases of the blood and blood-forming organs and certain disorders involving the immune mechanism: Secondary | ICD-10-CM | POA: Insufficient documentation

## 2014-08-14 DIAGNOSIS — M25511 Pain in right shoulder: Secondary | ICD-10-CM | POA: Diagnosis present

## 2014-08-14 DIAGNOSIS — Z87828 Personal history of other (healed) physical injury and trauma: Secondary | ICD-10-CM | POA: Insufficient documentation

## 2014-08-14 DIAGNOSIS — M19111 Post-traumatic osteoarthritis, right shoulder: Secondary | ICD-10-CM | POA: Insufficient documentation

## 2014-08-14 NOTE — ED Notes (Signed)
Pt reports rt shoulder pain x2 weeks, began after moving furniture around, pt states he had multiple dislocated shoulders in the past however does not seem dislocated at this time. Pt states pain has gotten progressively worse, has taken ibuprofen at home w/o relief.

## 2014-08-15 ENCOUNTER — Emergency Department (HOSPITAL_BASED_OUTPATIENT_CLINIC_OR_DEPARTMENT_OTHER)
Admission: EM | Admit: 2014-08-15 | Discharge: 2014-08-15 | Disposition: A | Discharge status: Home / Self Care | Payer: 59 | Attending: Emergency Medicine | Admitting: Emergency Medicine

## 2014-08-15 ENCOUNTER — Emergency Department (HOSPITAL_BASED_OUTPATIENT_CLINIC_OR_DEPARTMENT_OTHER): Payer: 59

## 2014-08-15 DIAGNOSIS — M19111 Post-traumatic osteoarthritis, right shoulder: Secondary | ICD-10-CM

## 2014-08-15 HISTORY — DX: Unspecified dislocation of unspecified shoulder joint, initial encounter: S43.006A

## 2014-08-15 MED ORDER — HYDROCODONE-ACETAMINOPHEN 5-325 MG PO TABS
2.0000 | ORAL_TABLET | Freq: Once | ORAL | Status: AC
  Administered 2014-08-15: 2 via ORAL
  Filled 2014-08-15: qty 2

## 2014-08-15 MED ORDER — HYDROCODONE-ACETAMINOPHEN 5-325 MG PO TABS: 1.0000 | ORAL_TABLET | Freq: Four times a day (QID) | ORAL | Status: DC | PRN

## 2014-08-15 NOTE — ED Provider Notes (Signed)
CSN: [id number]     Arrival date & time 08/14/14  2146 History   First MD Initiated Contact with Patient 08/15/14 0104     Chief Complaint  Patient presents with  . Shoulder Pain     (Consider location/radiation/quality/duration/timing/severity/associated sxs/prior Treatment) HPI  This is a 39 year old male with a mild case of hemophilia a. He does not have a regular he metallic disc and is only rarely had to have factor VIII infusions. He has a history of dislocation of the right shoulder. He is here with pain in the right shoulder since moving some furniture a week and a half ago. The pain began the day after he moved furniture. The pain begins in the middle of his neck and radiates down his right shoulder to his right elbow. The pain is sharp. The pain is worse with movement of the shoulder and improved with leaning his head to the right. The pain is not made worse with leaning of his head to the left or with other movements of the neck. There are no associated paresthesias. He describes the pain is severe enough to keep her from sleeping at night.  Past Medical History  Diagnosis Date  . Hemophilia   . Shoulder dislocation    Past Surgical History  Procedure Laterality Date  . Hernia repair     History reviewed. No pertinent family history. History  Substance Use Topics  . Smoking status: Never Smoker   . Smokeless tobacco: Not on file  . Alcohol Use: No    Review of Systems  All other systems reviewed and are negative.   Allergies  Review of patient's allergies indicates no known allergies.  Home Medications   Prior to Admission medications   Medication Sig Start Date End Date Taking? Authorizing Provider  ibuprofen (ADVIL,MOTRIN) 200 MG tablet Take 400 mg by mouth every 6 (six) hours as needed. For pain     Historical Provider, MD   BP 142/98 mmHg  Pulse 92  Temp(Src) 97.9 F (36.6 C) (Oral)  Resp 16  Ht 6\' 4"  (1.93 m)  Wt 270 lb (122.471 kg)  BMI 32.88 kg/m2   SpO2 95%   Physical Exam  General: Well-developed, well-nourished male in no acute distress; appearance consistent with age of record HENT: normocephalic; atraumatic Eyes: pupils equal, round and reactive to light; extraocular muscles intact Neck: supple Heart: regular rate and rhythm Lungs: clear to auscultation bilaterally Abdomen: soft; nondistended; nontender; bowel sounds present Extremities: No deformity; pain on range of motion of right shoulder with decreased range of motion due to pain, right upper extremity distally neurovascularly intact Neurologic: Awake, alert and oriented; motor function intact in all extremities and symmetric; no facial droop Skin: Warm and dry Psychiatric: Normal mood and affect    ED Course  Procedures (including critical care time)   MDM  Nursing notes and vitals signs, including pulse oximetry, reviewed.  Summary of this visit's results, reviewed by myself:  Imaging Studies: Dg Shoulder Right  08/15/2014   CLINICAL DATA:  RIGHT shoulder pain for 1 week, no injury. History of shoulder dislocations and hemophilia.  EXAM: RIGHT SHOULDER - 2+ VIEW  COMPARISON:  None.  FINDINGS: Severe glenohumeral joint space narrowing with marginal spurring, high-riding humeral head. No acute fracture deformity. No dislocation. No destructive bony lesions. Periarticular soft tissue planes are nonsuspicious.  IMPRESSION: Level severe glenohumeral osteoarthrosis without acute fracture deformity or dislocation.  High-riding humeral head suggest remote rotator cuff injury.   Electronically Signed  By: Elon Alas   On: 08/15/2014 01:50      Shanon Rosser, MD 08/15/14 9038

## 2015-02-04 ENCOUNTER — Ambulatory Visit (INDEPENDENT_AMBULATORY_CARE_PROVIDER_SITE_OTHER): Payer: 59 | Admitting: Psychology

## 2015-02-04 DIAGNOSIS — F1020 Alcohol dependence, uncomplicated: Secondary | ICD-10-CM

## 2015-02-17 ENCOUNTER — Ambulatory Visit (INDEPENDENT_AMBULATORY_CARE_PROVIDER_SITE_OTHER): Payer: 59 | Admitting: Psychology

## 2015-02-17 DIAGNOSIS — F1010 Alcohol abuse, uncomplicated: Secondary | ICD-10-CM | POA: Diagnosis not present

## 2015-02-23 ENCOUNTER — Ambulatory Visit: Payer: 59 | Admitting: Psychology

## 2015-02-25 ENCOUNTER — Ambulatory Visit: Payer: 59 | Admitting: Psychology

## 2015-02-26 ENCOUNTER — Ambulatory Visit (INDEPENDENT_AMBULATORY_CARE_PROVIDER_SITE_OTHER): Payer: 59 | Admitting: Psychology

## 2015-02-26 DIAGNOSIS — F1020 Alcohol dependence, uncomplicated: Secondary | ICD-10-CM

## 2015-03-11 ENCOUNTER — Ambulatory Visit: Payer: 59 | Admitting: Licensed Clinical Social Worker

## 2015-03-18 ENCOUNTER — Ambulatory Visit: Payer: 59 | Admitting: Psychology

## 2015-10-02 IMAGING — CR DG SHOULDER 2+V*R*
3 series · 3 of 3 positions shown · non-contrast
Comparison: None.

CLINICAL DATA: RIGHT shoulder pain for 1 week, no injury. History
of shoulder dislocations and hemophilia.

EXAM:
RIGHT SHOULDER - 2+ VIEW

[w shoulder ap internal righ]
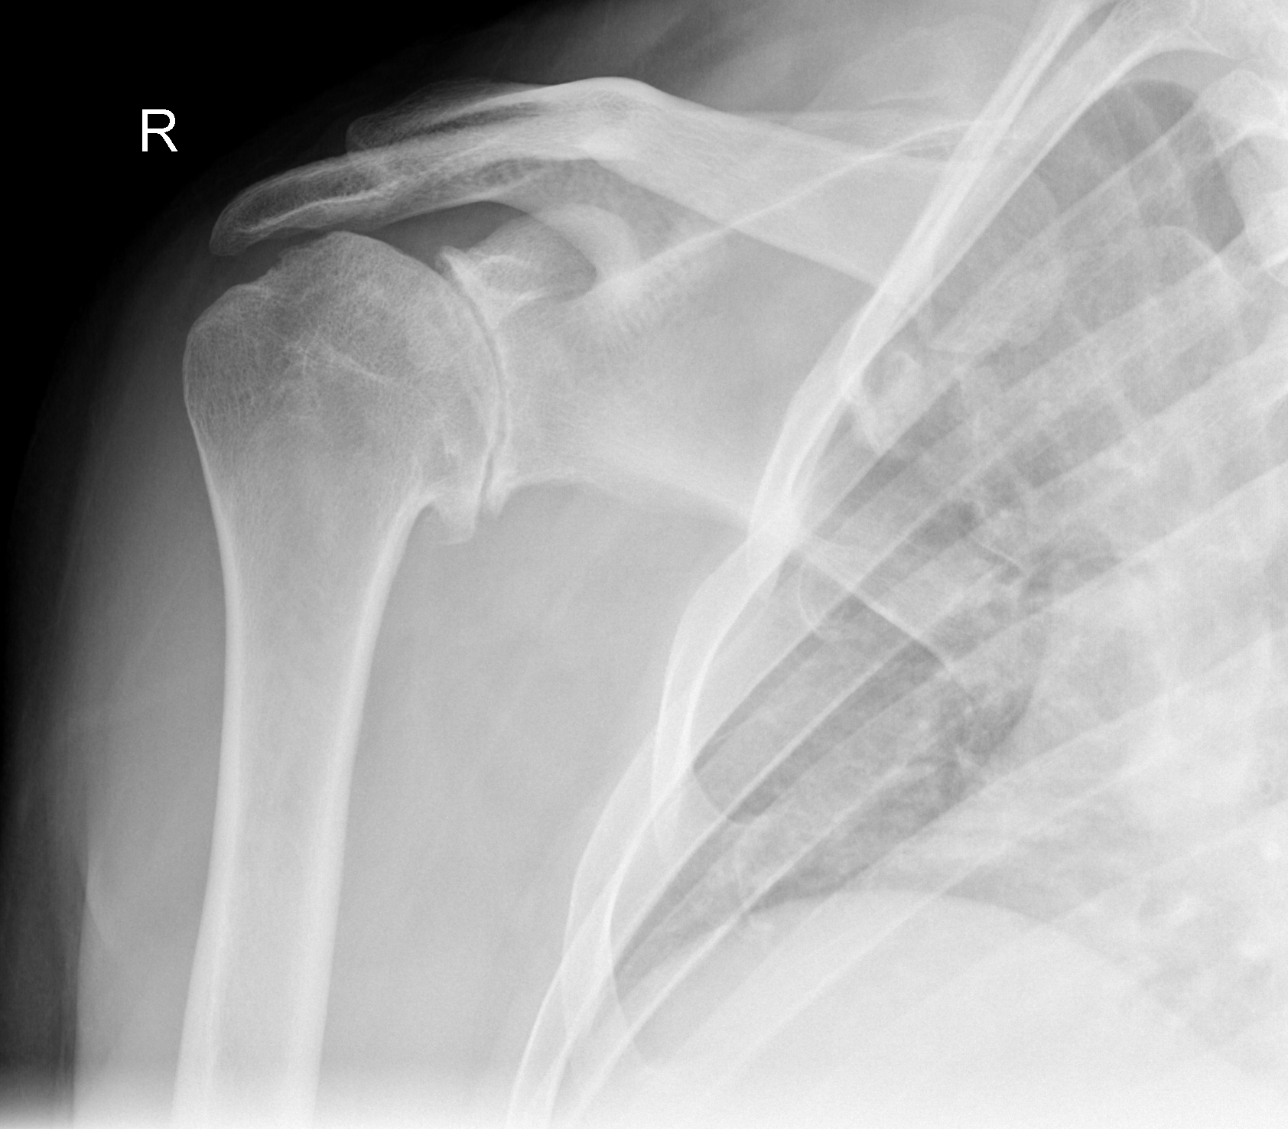

[w shoulder y view right]
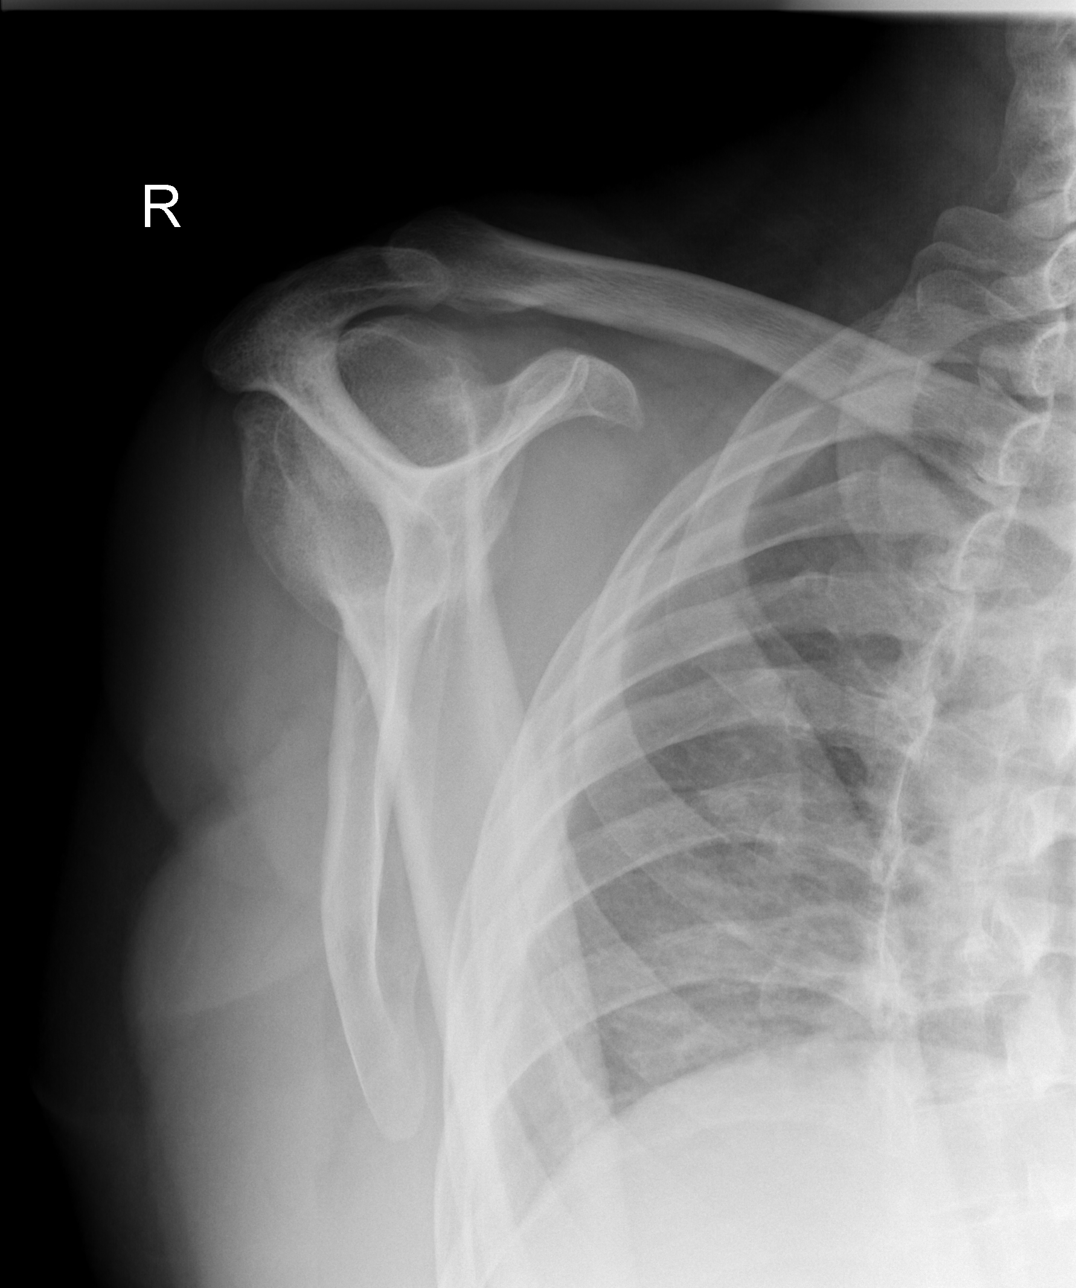

[x shoulder axillary right]
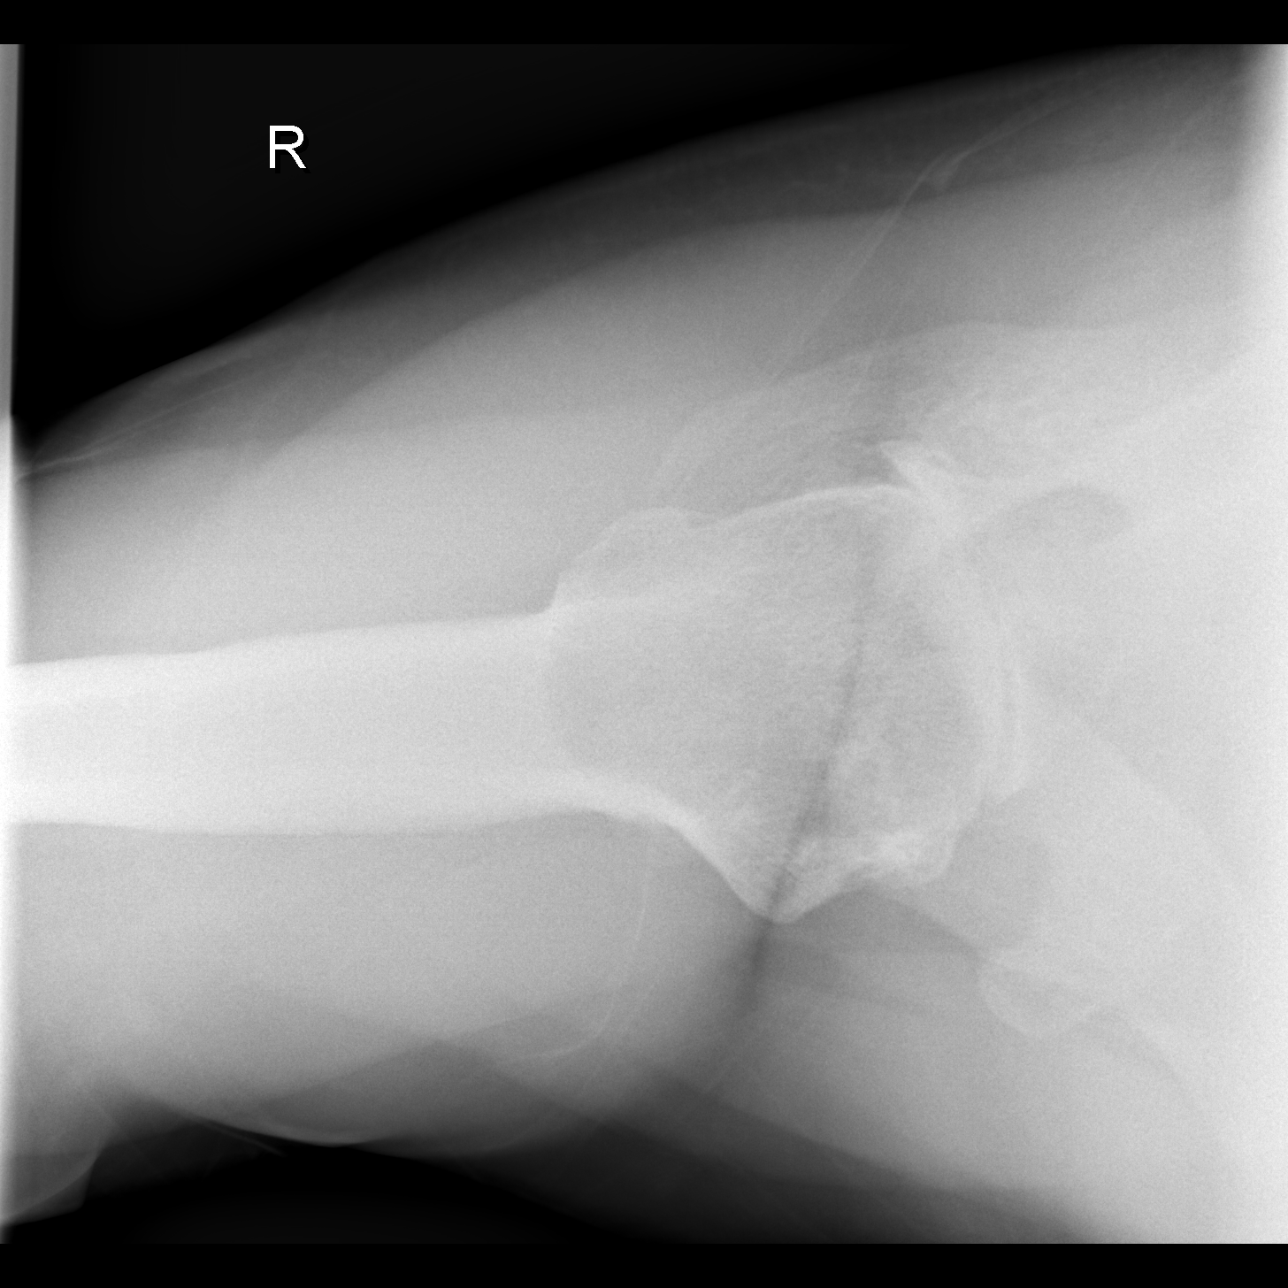

[3 of 3 positions shown; findings below may reference images not displayed]

FINDINGS: Severe glenohumeral joint space narrowing with marginal spurring,
high-riding humeral head. No acute fracture deformity. No
dislocation. No destructive bony lesions. Periarticular soft tissue
planes are nonsuspicious.
IMPRESSION: Level severe glenohumeral osteoarthrosis without acute fracture
deformity or dislocation.

High-riding humeral head suggest remote rotator cuff injury.

  By: Bambucafe Tarla

## 2019-07-12 ENCOUNTER — Ambulatory Visit: Payer: 59

## 2019-07-20 ENCOUNTER — Ambulatory Visit: Payer: Self-pay | Attending: Internal Medicine

## 2019-07-20 DIAGNOSIS — Z23 Encounter for immunization: Secondary | ICD-10-CM | POA: Insufficient documentation

## 2019-07-20 NOTE — Progress Notes (Signed)
   Covid-19 Vaccination Clinic  Name:  Caleb Mckinney    MRN: [id number] DOB: 02-10-1976  07/20/2019  [name] was observed post Covid-19 immunization for 30 minutes based on pre-vaccination screening without incidence. He was provided with Vaccine Information Sheet and instruction to access the V-Safe system.   [name] was instructed to call 911 with any severe reactions post vaccine: Marland Kitchen Difficulty breathing  . Swelling of your face and throat  . A fast heartbeat  . A bad rash all over your body  . Dizziness and weakness    Immunizations Administered    Name Date Dose VIS Date Route   Pfizer COVID-19 Vaccine 07/20/2019 12:32 PM 0.3 mL 05/10/2019 Intramuscular   Manufacturer: [place]   Lot: [id number]   [place]: [id number]

## 2019-08-13 ENCOUNTER — Ambulatory Visit: Payer: Self-pay | Attending: Internal Medicine

## 2019-08-13 DIAGNOSIS — Z23 Encounter for immunization: Secondary | ICD-10-CM

## 2019-08-13 NOTE — Progress Notes (Signed)
   Covid-19 Vaccination Clinic  Name:  Caleb Mckinney    MRN: [id number] DOB: 1975/09/19  08/13/2019  Caleb Mckinney was observed post Covid-19 immunization for 15 minutes without incident. He was provided with Vaccine Information Sheet and instruction to access the V-Safe system.   Caleb Mckinney was instructed to call 911 with any severe reactions post vaccine: Marland Kitchen Difficulty breathing  . Swelling of face and throat  . A fast heartbeat  . A bad rash all over body  . Dizziness and weakness   Immunizations Administered    Name Date Dose VIS Date Route   Pfizer COVID-19 Vaccine 08/13/2019 12:59 PM 0.3 mL 05/10/2019 Intramuscular   Manufacturer: [place]   Lot: [id number]   [place]: [id number]

## 2021-10-11 ENCOUNTER — Ambulatory Visit (HOSPITAL_COMMUNITY)
Admission: RE | Admit: 2021-10-11 | Discharge: 2021-10-11 | Disposition: A | Discharge status: Home / Self Care | Payer: BC Managed Care – PPO | Attending: Psychiatry | Admitting: Psychiatry

## 2021-10-11 DIAGNOSIS — F322 Major depressive disorder, single episode, severe without psychotic features: Secondary | ICD-10-CM

## 2021-10-11 DIAGNOSIS — F32A Depression, unspecified: Secondary | ICD-10-CM | POA: Diagnosis present

## 2021-10-11 DIAGNOSIS — R45851 Suicidal ideations: Secondary | ICD-10-CM | POA: Diagnosis not present

## 2021-10-11 NOTE — H&P (Signed)
Behavioral Health Medical Screening Exam ? ?Male Caleb Mckinney is a 46 y.o. male with a history of anxiety who presents to [facility] as a walk-in due to depression and SI with no plan or intent. "I don't like pain so it would have to be something that doesn't hurt. I have my children and would not want to do that to them." Pt reports that he has been depressed for a while. Pt reports that he does not have a psychiatrist or take any medication for managing depression. Pt states that he was given a prescription for Sertraline years ago but he didn't take it because he "doesn't like those pills". Pt states that he has had a few visits with a [employer] counselor via [employer] and found them somewhat helpful. Pt reports that he has a big family and his kids are what keeps him going.  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 Pt states that he has limited supports--feels like he has nobody.  Pt denies any HI or AVH.  Pt admits that he drinks alcohol once/week. Pt reports occasional cocaine use "its a painkiller but doesn't help anything".  Pt reports significant insomnia--only sleeps 1-2 hours at a time. ? 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 States that he has 7 children. The oldest daughter lives in [place]. The oldest son recently moved back home and they are not getting along. States the other 5 children live with he and his wife.  ? ?Denies access to guns/weapons. ? ?Total Time spent with patient: 30 minutes ? ?Psychiatric Specialty Exam: ? ?Presentation  ?General Appearance: Appropriate for Environment; Neat ? ?Eye Contact:Good ? ?Speech:Clear and Coherent; Normal Rate ? ?Speech Volume:Normal ? ?Handedness:No data recorded ? ?Mood and Affect  ?Mood:Anxious; Depressed; Hopeless; Worthless ? ?Affect:Congruent; Depressed; Tearful ? ? ?Thought Process  ?Thought Processes:Coherent ? ?Descriptions of  Associations:Intact ? ?Orientation:Full (Time, Place and Person) ? ?Thought Content:Logical ? ?History of Schizophrenia/Schizoaffective disorder:No ? ?Duration of Psychotic Symptoms:No data recorded ?Hallucinations:Hallucinations: None ? ?Ideas of Reference:None ? ?Suicidal Thoughts:Suicidal Thoughts: Yes, Active ? ?Homicidal Thoughts:Homicidal Thoughts: No ? ? ?Sensorium  ?Memory:Immediate Good; Recent Good; Remote Good ? ?Judgment:Intact ? ?Insight:Present ? ? ?Executive Functions  ?Concentration:Fair ? ?Attention Span:Fair ? ?Recall:Good ? ?[employer] [place] ? ?Language:Good ? ? ?Psychomotor Activity  ?Psychomotor Activity:Psychomotor Activity: Restlessness ? ? ?Assets  ?Assets:Communication Skills; Desire for Improvement; Housing; Physical Health; Social IT consultant; Talents/Skills ? ? ?Sleep  ?Sleep:Sleep: Poor ? ? ? ?Physical Exam: ?Physical Exam ?Constitutional:   ?   General: He is not in acute distress. ?   Appearance: He is not ill-appearing, toxic-appearing or diaphoretic.  ?HENT:  ?   Right Ear: External ear normal.  ?   Left Ear: External ear normal.  ?Eyes:  ?   Pupils: Pupils are equal, round, and reactive to light.  ?Cardiovascular:  ?   Rate and Rhythm: Normal rate.  ?Pulmonary:  ?   Effort: Pulmonary effort is normal. No respiratory distress.  ?Musculoskeletal:     ?   General: Normal range of motion.  ?Neurological:  ?   Mental Status: He is alert and oriented to person, place, and time.  ?Psychiatric:     ?   Thought Content: Thought content is not paranoid or delusional. Thought content includes suicidal ideation. Thought content does not include homicidal ideation. Thought content does not include suicidal plan.  ? ?Review of Systems  ?  Constitutional:  Negative for chills, diaphoresis, fever, malaise/fatigue and weight loss.  ?Respiratory:  Negative for cough and shortness of breath.   ?Cardiovascular:  Negative for chest pain and palpitations.  ?Gastrointestinal:  Negative for  diarrhea, nausea and vomiting.  ?Neurological:  Positive for headaches. Negative for dizziness and seizures.  ?Psychiatric/Behavioral:  Positive for depression and suicidal ideas. Negative for hallucinations and memory loss. The patient is nervous/anxious and has insomnia.   ? ?Blood pressure 128/76, pulse 78, temperature 98.4 ?F (36.9 ?C), resp. rate 20, SpO2 100 %. There is no height or weight on file to calculate BMI. ? ?Musculoskeletal: ?Strength & Muscle Tone: within normal limits ?Gait & Station: normal ?Patient leans: N/A ? ? ?Recommendations: ? ?Based on my evaluation the patient does not appear to have an emergency medical condition. ? ?Disposition: No evidence of imminent risk to self or others at present.   ?Supportive therapy provided about ongoing stressors. ?Discussed crisis plan, support from social network, calling 911, coming to the Emergency Department, and calling Suicide Hotline. Provided with information for [facility]. ? ?Patient declined inpatient treatment. States he is able to contract for safety. Spouse denies any safety concerns.  ?Please refrain from using alcohol or illicit substances, as they can affect your mood and can cause depression, anxiety or other concerning symptoms. ? ?Alcohol can increase the chance that a person will make reckless decisions, like attempting suicide, and can increase the lethality of a drug overdose.  ?  ? ?Rozetta Nunnery, NP ?10/11/2021, 11:18 PM ? ?

## 2021-10-11 NOTE — BH Assessment (Addendum)
Comprehensive Clinical Assessment (CCA) Note ? ?10/11/2021 ?Caleb Mckinney ?[id number] ? ?Chief Complaint:  ?Chief Complaint  ?Patient presents with  ? Suicidal  ? Depression  ? ?Visit Diagnosis:  ?MDD, single episode, severe ? ?Disposition: Per Lindon Romp NP pt meets inpatient criteria. [facility] has bed available per Corene Cornea. ? ?Flowsheet Row OP Visit from 10/11/2021 in [facility]  ?C-SSRS RISK CATEGORY Low Risk  ? ?  ?The patient demonstrates the following risk factors for suicide: Chronic risk factors for suicide include: psychiatric disorder of depression and chronic pain. Acute risk factors for suicide include: family or marital conflict and work-related stress . Protective factors for this patient include: responsibility to others (children, family). Considering these factors, the overall suicide risk at this point appears to be low. Patient is not appropriate for outpatient follow up. ?  ?Karanvir is a 46 yo male reporting to [facility] for evaluation of suicidal ideation. Pt reports that he does not have a specific plan "I don't like pain so it would have to be something that doesn't hurt".  Pt reports that he has been depressed for a while. Pt reports that he does not have a psychiatrist or take any medication for managing depression. Pt states that he was given a prescription for Sertraline years ago but he didn't take it because he "doesn't like those pills". Pt states that he has had a few visits with a [employer] counselor via [employer] and found them somewhat helpful. Pt reports that he has a big family and his kids are what keeps him going.  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 Pt states that he has limited supports--feels like he has nobody.  Pt denies any HI or AVH.  Pt admits that he drinks alcohol once/week. Pt reports occasional cocaine use "its a painkiller but doesn't help anything".  Pt reports  significant insomnia--only sleeps 1-2 hours at a time. ? ?CCA Screening, Triage and Referral (STR) ? ?Patient Reported Information ?How did you hear about [place]? Self ? ?Referral name: No data recorded ?Referral phone number: No data recorded ? ?Whom do you see for routine medical problems? No data recorded ?Practice/Facility Name: No data recorded ?Practice/Facility Phone Number: No data recorded ?Name of Contact: No data recorded ?Contact Number: No data recorded ?Contact Fax Number: No data recorded ?Prescriber Name: No data recorded ?Prescriber Address (if known): No data recorded ? ?What Is the Reason for Your Visit/Call Today? Caleb Mckinney is a 46 yo male reporting to [facility] for evaluation of suicidal ideation. Pt reports that he does not have a specific plan "I don't like pain so it would have to be something that doesn't hurt".  Pt reports that he has been depressed for a while. Pt reports that he does not have a psychiatrist or take any medication for managing depression. Pt states that he was given a prescription for Sertraline years ago but he didn't take it because he "doesn't like those pills". Pt states that he has had a few visits with a [employer] counselor via [employer] and found them somewhat helpful. Pt reports that he has a big family and his kids are what keeps him going.  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 Pt states that he has limited supports--feels like he has nobody.  Pt denies any HI or AVH.  Pt admits that he drinks alcohol once/week. Pt reports occasional cocaine  use "its a painkiller but doesn't help anything".  Pt reports significant insomnia--only sleeps 1-2 hours at a time. ? ?How Long Has This Been Causing You Problems? <Week ? ?What Do You Feel Would Help You the Most Today? Treatment for Depression or other mood problem ? ? ?Have You Recently Been in Any Inpatient Treatment (Hospital/Detox/Crisis Center/28-Day  Program)? No data recorded ?Name/Location of Program/Hospital:No data recorded ?How Long Were You There? No data recorded ?When Were You Discharged? No data recorded ? ?Have You Ever Received Services From [employer] Before? No data recorded ?Who Do You See at [facility]? No data recorded ? ?Have You Recently Had Any Thoughts About Hurting Yourself? Yes ? ?Are You Planning to Commit Suicide/Harm Yourself At This time? No ? ? ?Have you Recently Had Thoughts About [place]? No ? ?Explanation: No data recorded ? ?Have You Used Any Alcohol or Drugs in the Past 24 Hours? No ? ?How Long Ago Did You Use Drugs or Alcohol? No data recorded ?What Did You Use and How Much? No data recorded ? ?Do You Currently Have a Therapist/Psychiatrist? Yes ? ?Name of Therapist/Psychiatrist: Pt is seeing a [place] therapist ? ? ?Have You Been Recently Discharged From Any Office Practice or Programs? No ? ?Explanation of Discharge From Practice/Program: No data recorded ? ?  ?CCA Screening Triage Referral Assessment ?Type of Contact: Face-to-Face ? ?Is this Initial or Reassessment? No data recorded ?Date Telepsych consult ordered in [facility]:  No data recorded ?Time Telepsych consult ordered in [facility]:  No data recorded ? ?Patient Reported Information Reviewed? No data recorded ?Patient Left Without Being Seen? No data recorded ?Reason for Not Completing Assessment: No data recorded ? ?Collateral Involvement: Pt wife is with pt but pt did not want her present throughout assessment process ? ? ?Does Patient Have a Stage manager Guardian? No data recorded ?Name and Contact of Legal Guardian: No data recorded ?If Minor and Not Living with Parent(s), Who has Custody? No data recorded ?Is CPS involved or ever been involved? Never ? ?Is APS involved or ever been involved? Never ? ? ?Patient Determined To Be At Risk for Harm To Self or Others Based on Review of Patient Reported Information or Presenting Complaint? Yes, for  Self-Harm ? ?Method: No data recorded ?Availability of Means: No data recorded ?Intent: No data recorded ?Notification Required: No data recorded ?Additional Information for Danger to Others Potential: No data recorded ?Additional Comments for Danger to Others Potential: No data recorded ?Are There Guns or Other Weapons in University? No data recorded ?Types of Guns/Weapons: No data recorded ?Are These Weapons Safely Secured?                            No data recorded ?Who Could Verify You Are Able To Have These Secured: No data recorded ?Do You Have any Outstanding Charges, Pending Court Dates, Parole/Probation? No data recorded ?Contacted To Inform of Risk of Harm To Self or Others: No data recorded ? ?Location of Assessment: [facility] ? ? ?Does Patient Present under Involuntary Commitment? No ? ?IVC Papers Initial File Date: No data recorded ? ?[place] of Residence: No data recorded ? ?Patient Currently Receiving the Following Services: Individual Therapy ? ? ?Determination of Need: Emergent (2 hours) ? ? ?Options For Referral: Inpatient Hospitalization ? ? ? ? ?CCA Biopsychosocial ?Intake/Chief Complaint:  No data recorded ?Current Symptoms/Problems: No data recorded ? ?Patient Reported Schizophrenia/Schizoaffective Diagnosis in  Past: No ? ? ?Strengths: pt came to [facility] seeking help managing symptoms ? ?Preferences: No data recorded ?Abilities: No data recorded ? ?Type of Services Patient Feels are Needed: No data recorded ? ?Initial Clinical Notes/Concerns: No data recorded ? ?Mental Health Symptoms ?Depression:  Change in energy/activity; Sleep (too much or little); Tearfulness; Hopelessness; Worthlessness; Irritability ?  ?Duration of Depressive symptoms: Greater than two weeks ?  ?Mania:  Irritability ?  ?Anxiety:   Worrying; Fatigue; Irritability; Sleep; Restlessness ?  ?Psychosis:  None ?  ?Duration of Psychotic symptoms: No data recorded  ?Trauma:  None ?  ?Obsessions:  N/A ?  ?Compulsions:   N/A ?  ?Inattention:  N/A ?  ?Hyperactivity/Impulsivity:  N/A ?  ?Oppositional/Defiant Behaviors:  N/A ?  ?Emotional Irregularity:  N/A ?  ?Other Mood/Personality Symptoms:  No data recorded  ? ?Mental Status Exam

## 2021-12-23 ENCOUNTER — Emergency Department (HOSPITAL_COMMUNITY)
Admission: EM | Admit: 2021-12-23 | Discharge: 2021-12-24 | Disposition: A | Discharge status: Home / Self Care | Payer: BC Managed Care – PPO | Attending: Emergency Medicine | Admitting: Emergency Medicine

## 2021-12-23 ENCOUNTER — Emergency Department (HOSPITAL_COMMUNITY): Payer: BC Managed Care – PPO

## 2021-12-23 ENCOUNTER — Encounter (HOSPITAL_COMMUNITY): Payer: Self-pay

## 2021-12-23 ENCOUNTER — Other Ambulatory Visit: Payer: Self-pay

## 2021-12-23 DIAGNOSIS — R0602 Shortness of breath: Secondary | ICD-10-CM | POA: Insufficient documentation

## 2021-12-23 DIAGNOSIS — R079 Chest pain, unspecified: Secondary | ICD-10-CM | POA: Diagnosis present

## 2021-12-23 DIAGNOSIS — Z20822 Contact with and (suspected) exposure to covid-19: Secondary | ICD-10-CM | POA: Insufficient documentation

## 2021-12-23 DIAGNOSIS — R000 Tachycardia, unspecified: Secondary | ICD-10-CM | POA: Diagnosis not present

## 2021-12-23 DIAGNOSIS — R42 Dizziness and giddiness: Secondary | ICD-10-CM | POA: Diagnosis not present

## 2021-12-23 LAB — CBC
HCT: 44.5 % (ref 39.0–52.0)
Hemoglobin: 14.6 g/dL (ref 13.0–17.0)
MCH: 32.4 pg (ref 26.0–34.0)
MCHC: 32.8 g/dL (ref 30.0–36.0)
MCV: 98.7 fL (ref 80.0–100.0)
Platelets: 300 10*3/uL (ref 150–400)
RBC: 4.51 MIL/uL (ref 4.22–5.81)
RDW: 11.1 % — ABNORMAL LOW (ref 11.5–15.5)
WBC: 10.2 10*3/uL (ref 4.0–10.5)
nRBC: 0 % (ref 0.0–0.2)

## 2021-12-23 LAB — HEPATIC FUNCTION PANEL
ALT: 72 U/L — ABNORMAL HIGH (ref 0–44)
AST: 38 U/L (ref 15–41)
Albumin: 4.2 g/dL (ref 3.5–5.0)
Alkaline Phosphatase: 73 U/L (ref 38–126)
Bilirubin, Direct: 0.2 mg/dL (ref 0.0–0.2)
Indirect Bilirubin: 1 mg/dL — ABNORMAL HIGH (ref 0.3–0.9)
Total Bilirubin: 1.2 mg/dL (ref 0.3–1.2)
Total Protein: 8.2 g/dL — ABNORMAL HIGH (ref 6.5–8.1)

## 2021-12-23 LAB — SARS CORONAVIRUS 2 BY RT PCR: SARS Coronavirus 2 by RT PCR: NEGATIVE

## 2021-12-23 LAB — BASIC METABOLIC PANEL
Anion gap: 9 (ref 5–15)
BUN: 15 mg/dL (ref 6–20)
CO2: 25 mmol/L (ref 22–32)
Calcium: 9 mg/dL (ref 8.9–10.3)
Chloride: 101 mmol/L (ref 98–111)
Creatinine, Ser: 1.2 mg/dL (ref 0.61–1.24)
GFR, Estimated: >60 mL/min (ref >60)
Glucose, Bld: 302 mg/dL — ABNORMAL HIGH (ref 70–99)
Potassium: 4 mmol/L (ref 3.5–5.1)
Sodium: 135 mmol/L (ref 135–145)

## 2021-12-23 LAB — TROPONIN I (HIGH SENSITIVITY)
Troponin I (High Sensitivity): 5 ng/L (ref <18)
Troponin I (High Sensitivity): 5 ng/L (ref <18)

## 2021-12-23 MED ORDER — LORAZEPAM 2 MG/ML IJ SOLN
1.0000 mg | Freq: Once | INTRAMUSCULAR | Status: AC
Start: 2021-12-23 — End: 2021-12-23
  Administered 2021-12-23: 1 mg via INTRAVENOUS
  Filled 2021-12-23: qty 1

## 2021-12-23 MED ORDER — ASPIRIN 325 MG PO TABS
325.0000 mg | ORAL_TABLET | Freq: Every day | ORAL | Status: DC
  Administered 2021-12-23: 325 mg via ORAL
  Filled 2021-12-23: qty 1

## 2021-12-23 MED ORDER — MORPHINE SULFATE (PF) 2 MG/ML IV SOLN
2.0000 mg | Freq: Once | INTRAVENOUS | Status: AC
  Administered 2021-12-23: 2 mg via INTRAVENOUS
  Filled 2021-12-23: qty 1

## 2021-12-23 MED ORDER — KETOROLAC TROMETHAMINE 30 MG/ML IJ SOLN
30.0000 mg | Freq: Once | INTRAMUSCULAR | Status: AC
  Administered 2021-12-23: 30 mg via INTRAVENOUS
  Filled 2021-12-23: qty 1

## 2021-12-23 MED ORDER — ACETAMINOPHEN 500 MG PO TABS
1000.0000 mg | ORAL_TABLET | Freq: Once | ORAL | Status: AC
  Administered 2021-12-23: 1000 mg via ORAL
  Filled 2021-12-23: qty 2

## 2021-12-23 MED ORDER — IOHEXOL 350 MG/ML SOLN
70.0000 mL | Freq: Once | INTRAVENOUS | Status: AC | PRN
  Administered 2021-12-23: 70 mL via INTRAVENOUS

## 2021-12-23 MED ORDER — LACTATED RINGERS IV BOLUS
1000.0000 mL | Freq: Once | INTRAVENOUS | Status: AC
  Administered 2021-12-23: 1000 mL via INTRAVENOUS

## 2021-12-23 NOTE — ED Notes (Signed)
Patient transported to CT 

## 2021-12-23 NOTE — ED Triage Notes (Signed)
Pt arrived POV c/o left sided CP that started a couple of hours ago. Pt endorses SHOB and dizziness with the pain.

## 2021-12-23 NOTE — ED Provider Triage Note (Signed)
Emergency Medicine Provider Triage Evaluation Note  Caleb Mckinney , a 46 y.o. male  was evaluated in triage.  Pt complains of chest pain.  Pain began 2 hours ago while he was having lunch with a colleague, described as sharp and 10 sensation to the substernal area with radiation to his left chest.  No prior episodes in the past.  Thought that this was likely ingestion therefore he took some Sudafed and vitamin C powder.  No tobacco use.   Review of Systems  Positive: Chest pain, palpitations Negative: Sob, leg swelling  Physical Exam  BP (!) 174/136 (BP Location: Right Arm)   Pulse (!) 129   Temp 99 F (37.2 C) (Oral)   Resp 14   SpO2 97%  Gen:   Awake, no distress   Resp:  Normal effort  MSK:   Moves extremities without difficulty  Other:    Medical Decision Making  Medically screening exam initiated at 1:59 PM.  Appropriate orders placed.  Caleb Mckinney was informed that the remainder of the evaluation will be completed by another provider, this initial triage assessment does not replace that evaluation, and the importance of remaining in the ED until their evaluation is complete.  Arrived sinus tach heart rate 129.  No prior history of heart disease, no tobacco use denies any drug use.  Given 325 aspirin in triage to help with symptomatic pain.   Caleb Fitting, PA-C 12/23/21 1405

## 2021-12-23 NOTE — ED Provider Notes (Signed)
[facility] EMERGENCY DEPARTMENT Provider Note   CSN: [id number] Arrival date & time: 12/23/21  1349     History  Chief Complaint  Patient presents with   Chest Pain    Caleb Mckinney is a 46 y.o. male.   Chest Pain  Patient is a 46 year old male who presents emergency department for evaluation of left-sided chest pain.  Patient states that a few hours ago he had onset of left-sided chest pain with associated shortness of breath.  He had some associated lightheadedness with the pain.  He denies a history of similar symptoms.  He denies any recent long travel or surgeries, is not currently on hormone replacement therapy and denies a history of DVT/PE.  He denies any nausea, vomiting, diarrhea.  He does report current left-sided chest pain.  He denies current associated shortness of breath or diaphoresis.    Home Medications Prior to Admission medications   Medication Sig Start Date End Date Taking? Authorizing Provider  HYDROcodone-acetaminophen (NORCO/VICODIN) 5-325 MG per tablet Take 1-2 tablets by mouth every 6 (six) hours as needed (for pain). 08/15/14   Molpus, John, MD  ibuprofen (ADVIL,MOTRIN) 200 MG tablet Take 400 mg by mouth every 6 (six) hours as needed. For pain     [provider]      Allergies    Patient has no known allergies.    Review of Systems   Review of Systems  Cardiovascular:  Positive for chest pain.    Physical Exam Updated Vital Signs BP 110/80   Pulse (!) 106   Temp 98.7 F (37.1 C) (Oral)   Resp 18   Ht '6\' 4"'$  (1.93 m)   Wt 117.9 kg   SpO2 94%   BMI 31.65 kg/m  Physical Exam Vitals and nursing note reviewed.  Constitutional:      General: He is not in acute distress.    Appearance: He is well-developed.  HENT:     Head: Atraumatic.  Eyes:     Extraocular Movements: Extraocular movements intact.     Conjunctiva/sclera: Conjunctivae normal.     Pupils: Pupils are equal, round, and reactive to light.  Neck:      Vascular: No JVD.  Cardiovascular:     Rate and Rhythm: Regular rhythm. Tachycardia present.     Pulses:          Carotid pulses are 2+ on the right side and 2+ on the left side.      Radial pulses are 2+ on the right side and 2+ on the left side.       Dorsalis pedis pulses are 2+ on the right side and 2+ on the left side.       Posterior tibial pulses are 2+ on the right side and 2+ on the left side.     Heart sounds: No murmur heard. Pulmonary:     Effort: Pulmonary effort is normal. No respiratory distress.     Breath sounds: Normal breath sounds. No decreased breath sounds, wheezing, rhonchi or rales.  Abdominal:     Palpations: Abdomen is soft.     Tenderness: There is no abdominal tenderness.  Musculoskeletal:        General: No swelling.     Cervical back: Neck supple.     Right lower leg: No tenderness. No edema.     Left lower leg: No tenderness. No edema.  Skin:    General: Skin is warm and dry.     Capillary Refill: Capillary  refill takes less than 2 seconds.     Findings: No rash.  Neurological:     General: No focal deficit present.     Mental Status: He is alert and oriented to person, place, and time.  Psychiatric:        Mood and Affect: Mood normal.     ED Results / Procedures / Treatments   Labs (all labs ordered are listed, but only abnormal results are displayed) Labs Reviewed  BASIC METABOLIC PANEL - Abnormal; Notable for the following components:      Result Value   Glucose, Bld 302 (*)    All other components within normal limits  CBC - Abnormal; Notable for the following components:   RDW 11.1 (*)    All other components within normal limits  HEPATIC FUNCTION PANEL - Abnormal; Notable for the following components:   Total Protein 8.2 (*)    ALT 72 (*)    Indirect Bilirubin 1.0 (*)    All other components within normal limits  SARS CORONAVIRUS 2 BY RT PCR  TROPONIN I (HIGH SENSITIVITY)  TROPONIN I (HIGH SENSITIVITY)    EKG EKG  Interpretation  Date/Time:  Thursday December 23 2021 13:58:40 EDT Ventricular Rate:  126 PR Interval:  140 QRS Duration: 84 QT Interval:  278 QTC Calculation: 402 R Axis:   37 Text Interpretation: Sinus tachycardia Septal infarct , age undetermined Abnormal ECG No previous ECGs available Confirmed by Nanda Quinton [phone]) on 12/23/2021 7:03:59 PM  Radiology CT Angio Chest PE W and/or Wo Contrast  Result Date: 12/23/2021 CLINICAL DATA:  Pulmonary embolism (PE) suspected, high prob EXAM: CT ANGIOGRAPHY CHEST WITH CONTRAST TECHNIQUE: Multidetector CT imaging of the chest was performed using the standard protocol during bolus administration of intravenous contrast. Multiplanar CT image reconstructions and MIPs were obtained to evaluate the vascular anatomy. RADIATION DOSE REDUCTION: This exam was performed according to the departmental dose-optimization program which includes automated exposure control, adjustment of the mA and/or kV according to patient size and/or use of iterative reconstruction technique. CONTRAST:  21m OMNIPAQUE IOHEXOL 350 MG/ML SOLN COMPARISON:  CT abdomen pelvis 03/06/2010, chest x-ray 12/23/2021 FINDINGS: Cardiovascular: Satisfactory opacification of the pulmonary arteries to the segmental level. No evidence of pulmonary embolism. Limited evaluation of the subsegmental level due to timing of contrast. The main pulmonary artery is normal in caliber. Normal heart size. No significant pericardial effusion. The thoracic aorta is normal in caliber. No atherosclerotic plaque of the thoracic aorta. Left anterior descending coronary artery calcifications. Mediastinum/Nodes: No enlarged mediastinal, hilar, or axillary lymph nodes. Thyroid gland, trachea, and esophagus demonstrate no significant findings. Lungs/Pleura: Elevated right hemidiaphragm. Left lower lobe and lingular atelectasis versus scarring. No focal consolidation. No pulmonary nodule. No pulmonary mass. No pleural effusion. No  pneumothorax. Upper Abdomen: No acute abnormality. Musculoskeletal: No abdominal wall hernia or abnormality. No suspicious lytic or blastic osseous lesions. No acute displaced fracture. Right shoulder degenerative changes. Review of the MIP images confirms the above findings. IMPRESSION: 1. No central or segmental pulmonary embolus. Limited evaluation more distally due to timing of contrast. 2. No acute intrathoracic abnormality. 3. Left anterior descending coronary calcification. Electronically Signed   By: MIven FinnM.D.   On: 12/23/2021 21:11   DG Chest 1 View  Result Date: 12/23/2021 CLINICAL DATA:  Chest pain with shortness of breath. EXAM: CHEST  1 VIEW COMPARISON:  Chest x-ray 12/02/2015. FINDINGS: Low lung volumes. Streaky bibasilar opacities. No confluent consolidation. No visible pleural effusions or pneumothorax. Cardiomediastinal silhouette  is accentuated by technique. No acute osseous abnormality. IMPRESSION: Low lung volumes with streaky bibasilar opacities, probably atelectasis. Electronically Signed   By: Margaretha Sheffield M.D.   On: 12/23/2021 14:25    Procedures Procedures    Medications Ordered in ED Medications  aspirin tablet 325 mg (325 mg Oral Given 12/23/21 1406)  acetaminophen (TYLENOL) tablet 1,000 mg (1,000 mg Oral Given 12/23/21 1924)  morphine (PF) 2 MG/ML injection 2 mg (2 mg Intravenous Given 12/23/21 2031)  iohexol (OMNIPAQUE) 350 MG/ML injection 70 mL (70 mLs Intravenous Contrast Given 12/23/21 2042)  ketorolac (TORADOL) 30 MG/ML injection 30 mg (30 mg Intravenous Given 12/23/21 2154)  LORazepam (ATIVAN) injection 1 mg (1 mg Intravenous Given 12/23/21 2154)  lactated ringers bolus 1,000 mL (1,000 mLs Intravenous New Bag/Given 12/23/21 2307)    ED Course/ Medical Decision Making/ A&P                           Medical Decision Making Problems Addressed: Nonspecific chest pain: complicated acute illness or injury with systemic symptoms  Amount and/or  Complexity of Data Reviewed Independent Historian: spouse External Data Reviewed: labs, radiology and notes. Labs: ordered. Radiology: ordered. ECG/medicine tests: ordered.  Risk Prescription drug management.   Patient is a 47 year old male who presents emergency department as above.  On initial presentation patient's blood pressure was 174/136 and he is tachycardic at 129.  He is saturating appropriately on room air and is afebrile.  Physical exam reveals an uncomfortable appearing male complaining of left-sided chest pain.  He is not diaphoretic.  Initial temperature was 100.3.  He was given Tylenol from triage.  Suspicion is highest for PE versus pneumonia versus ACS.  Based on laboratory work and imaging studies will be ordered.  BMP grossly unremarkable.  Delta troponin 5 and 5 respectively.  CBC without evidence of leukocytosis or anemia.  COVID flu swab negative.  Chest x-ray with streaky bibasilar opacities likely atelectasis.  CT angio without any evidence of PE.  Patient had been given a dose of fluids as well as Toradol and 1 dose of Ativan in the emergency department with relief of his symptoms.  On reevaluation patient was resting comfortably in bed.  He was given an additional liter of fluids in the setting of his tachycardia.  Patient felt comfortable following up in the outpatient setting with his primary care provider for symptom recheck.  He was given return precautions.  Plan and findings were discussed with patient at bedside and he verbalized understanding was agreeable.  Patient discharged from the ED in stable condition.        Final Clinical Impression(s) / ED Diagnoses Final diagnoses:  Nonspecific chest pain    Rx / DC Orders ED Discharge Orders     None         Nelta Numbers, MD 12/24/21 0107    Margette Fast, MD 12/26/21 1622

## 2021-12-23 NOTE — Discharge Instructions (Signed)
You are seen in the emergency department for evaluation of chest pain which had onset earlier today.  Laboratory work was very reassuring and did not show signs of an acute electrolyte abnormality or heart attack.  The CT scan performed did not show any signs of blood clots in your lungs.  Please follow-up with the provider group listed above in the coming days for symptom recheck and establishment of care.  Please continue appropriate p.o. hydration and rest at home.

## 2021-12-24 ENCOUNTER — Telehealth: Payer: Self-pay

## 2021-12-24 NOTE — Telephone Encounter (Signed)
RNCM received inbound call from patient's wife questioning medications that were prescribed. This RNCM reviewed recent AVS, advised no medications were precribed, pt. Will need to follow up with his PCP.   No additional TOC needs at this time.

## 2022-01-13 ENCOUNTER — Emergency Department (HOSPITAL_BASED_OUTPATIENT_CLINIC_OR_DEPARTMENT_OTHER): Payer: BC Managed Care – PPO

## 2022-01-13 ENCOUNTER — Encounter (HOSPITAL_BASED_OUTPATIENT_CLINIC_OR_DEPARTMENT_OTHER): Payer: Self-pay | Admitting: Emergency Medicine

## 2022-01-13 ENCOUNTER — Emergency Department (HOSPITAL_BASED_OUTPATIENT_CLINIC_OR_DEPARTMENT_OTHER)
Admission: EM | Admit: 2022-01-13 | Discharge: 2022-01-13 | Disposition: A | Discharge status: Home / Self Care | Payer: BC Managed Care – PPO | Attending: Emergency Medicine | Admitting: Emergency Medicine

## 2022-01-13 ENCOUNTER — Other Ambulatory Visit: Payer: Self-pay

## 2022-01-13 DIAGNOSIS — R110 Nausea: Secondary | ICD-10-CM | POA: Diagnosis not present

## 2022-01-13 DIAGNOSIS — N200 Calculus of kidney: Secondary | ICD-10-CM

## 2022-01-13 DIAGNOSIS — R109 Unspecified abdominal pain: Secondary | ICD-10-CM | POA: Insufficient documentation

## 2022-01-13 DIAGNOSIS — R319 Hematuria, unspecified: Secondary | ICD-10-CM | POA: Insufficient documentation

## 2022-01-13 LAB — CBC WITH DIFFERENTIAL/PLATELET
Abs Immature Granulocytes: 0.02 10*3/uL (ref 0.00–0.07)
Basophils Absolute: 0 10*3/uL (ref 0.0–0.1)
Basophils Relative: 0 %
Eosinophils Absolute: 0.2 10*3/uL (ref 0.0–0.5)
Eosinophils Relative: 2 %
HCT: 42.3 % (ref 39.0–52.0)
Hemoglobin: 14.2 g/dL (ref 13.0–17.0)
Immature Granulocytes: 0 %
Lymphocytes Relative: 21 %
Lymphs Abs: 1.9 10*3/uL (ref 0.7–4.0)
MCH: 32.7 pg (ref 26.0–34.0)
MCHC: 33.6 g/dL (ref 30.0–36.0)
MCV: 97.5 fL (ref 80.0–100.0)
Monocytes Absolute: 0.8 10*3/uL (ref 0.1–1.0)
Monocytes Relative: 9 %
Neutro Abs: 5.8 10*3/uL (ref 1.7–7.7)
Neutrophils Relative %: 68 %
Platelets: 305 10*3/uL (ref 150–400)
RBC: 4.34 MIL/uL (ref 4.22–5.81)
RDW: 11 % — ABNORMAL LOW (ref 11.5–15.5)
WBC: 8.6 10*3/uL (ref 4.0–10.5)
nRBC: 0 % (ref 0.0–0.2)

## 2022-01-13 LAB — URINALYSIS, ROUTINE W REFLEX MICROSCOPIC
Bilirubin Urine: NEGATIVE
Glucose, UA: >=500 mg/dL — AB
Ketones, ur: NEGATIVE mg/dL
Leukocytes,Ua: NEGATIVE
Nitrite: NEGATIVE
Protein, ur: 30 mg/dL — AB
Specific Gravity, Urine: >=1.03 (ref 1.005–1.030)
pH: 5.5 (ref 5.0–8.0)

## 2022-01-13 LAB — BASIC METABOLIC PANEL
Anion gap: 6 (ref 5–15)
BUN: 18 mg/dL (ref 6–20)
CO2: 27 mmol/L (ref 22–32)
Calcium: 8.7 mg/dL — ABNORMAL LOW (ref 8.9–10.3)
Chloride: 103 mmol/L (ref 98–111)
Creatinine, Ser: 1.37 mg/dL — ABNORMAL HIGH (ref 0.61–1.24)
GFR, Estimated: >60 mL/min (ref >60)
Glucose, Bld: 301 mg/dL — ABNORMAL HIGH (ref 70–99)
Potassium: 4 mmol/L (ref 3.5–5.1)
Sodium: 136 mmol/L (ref 135–145)

## 2022-01-13 LAB — URINALYSIS, MICROSCOPIC (REFLEX)

## 2022-01-13 MED ORDER — SODIUM CHLORIDE 0.9 % IV BOLUS
1000.0000 mL | Freq: Once | INTRAVENOUS | Status: AC
  Administered 2022-01-13: 1000 mL via INTRAVENOUS

## 2022-01-13 MED ORDER — SODIUM CHLORIDE 0.9 % IV SOLN
12.5000 mg | INTRAVENOUS | Status: AC
  Administered 2022-01-13: 12.5 mg via INTRAVENOUS
  Filled 2022-01-13: qty 0.5

## 2022-01-13 MED ORDER — SODIUM CHLORIDE 0.9 % IV SOLN: INTRAVENOUS | Status: DC | PRN

## 2022-01-13 MED ORDER — MORPHINE SULFATE (PF) 4 MG/ML IV SOLN
4.0000 mg | Freq: Once | INTRAVENOUS | Status: AC
  Administered 2022-01-13: 4 mg via INTRAVENOUS
  Filled 2022-01-13: qty 1

## 2022-01-13 MED ORDER — PROMETHAZINE HCL 25 MG/ML IJ SOLN
INTRAMUSCULAR | Status: AC
  Filled 2022-01-13: qty 1

## 2022-01-13 MED ORDER — ONDANSETRON HCL 4 MG/2ML IJ SOLN
4.0000 mg | Freq: Once | INTRAMUSCULAR | Status: AC
  Administered 2022-01-13: 4 mg via INTRAVENOUS
  Filled 2022-01-13: qty 2

## 2022-01-13 MED ORDER — KETOROLAC TROMETHAMINE 30 MG/ML IJ SOLN
30.0000 mg | Freq: Once | INTRAMUSCULAR | Status: AC
  Administered 2022-01-13: 30 mg via INTRAVENOUS
  Filled 2022-01-13: qty 1

## 2022-01-13 MED ORDER — OXYCODONE-ACETAMINOPHEN 5-325 MG PO TABS: 1.0000 | ORAL_TABLET | Freq: Four times a day (QID) | ORAL | 0 refills | Status: DC | PRN

## 2022-01-13 NOTE — Discharge Instructions (Signed)
Take ibuprofen 600 mg every 6 hours as needed for pain.  Percocet as prescribed as needed for pain not relieved with ibuprofen.  Follow-up with primary doctor if symptoms are not improving in the next few days.

## 2022-01-13 NOTE — ED Provider Notes (Signed)
[facility] EMERGENCY DEPARTMENT Provider Note   CSN: [id number] Arrival date & time: 01/13/22  0007     History  Chief Complaint  Patient presents with   Flank Pain    Caleb Mckinney is a 46 y.o. male.  Patient is a 46 year old male with history of renal calculi.  Patient presenting today with complaints of left flank pain.  This started acutely this evening and was preceded by blood in his urine yesterday.  This is how he has presented in the past with kidney stones.  He describes severe pain to the left flank with no aggravating or alleviating factors.  He denies any bowel complaints.  He denies any fevers or chills.  The history is provided by the patient.       Home Medications Prior to Admission medications   Medication Sig Start Date End Date Taking? Authorizing Provider  HYDROcodone-acetaminophen (NORCO/VICODIN) 5-325 MG per tablet Take 1-2 tablets by mouth every 6 (six) hours as needed (for pain). 08/15/14   Molpus, John, MD  ibuprofen (ADVIL,MOTRIN) 200 MG tablet Take 400 mg by mouth every 6 (six) hours as needed. For pain     [provider]      Allergies    Patient has no known allergies.    Review of Systems   Review of Systems  All other systems reviewed and are negative.   Physical Exam Updated Vital Signs BP (!) 176/128 (BP Location: Right Arm) Comment: Simultaneous filing. User may not have seen previous data. Comment (BP Location): Simultaneous filing. User may not have seen previous data.  Pulse 81 Comment: Simultaneous filing. User may not have seen previous data.  Temp 97.8 F (36.6 C) (Oral) Comment: Simultaneous filing. User may not have seen previous data. Comment (Src): Simultaneous filing. User may not have seen previous data.  Resp 18 Comment: Simultaneous filing. User may not have seen previous data.  Ht '6\' 4"'$  (1.93 m)   Wt 117.9 kg   SpO2 99% Comment: Simultaneous filing. User may not have seen previous data.  BMI 31.65  kg/m  Physical Exam Vitals and nursing note reviewed.  Constitutional:      General: He is not in acute distress.    Appearance: He is well-developed. He is not diaphoretic.  HENT:     Head: Normocephalic and atraumatic.  Cardiovascular:     Rate and Rhythm: Normal rate and regular rhythm.     Heart sounds: No murmur heard.    No friction rub.  Pulmonary:     Effort: Pulmonary effort is normal. No respiratory distress.     Breath sounds: Normal breath sounds. No wheezing or rales.  Abdominal:     General: Bowel sounds are normal. There is no distension.     Palpations: Abdomen is soft.     Tenderness: There is no abdominal tenderness. There is left CVA tenderness.  Musculoskeletal:        General: Normal range of motion.     Cervical back: Normal range of motion and neck supple.  Skin:    General: Skin is warm and dry.  Neurological:     Mental Status: He is alert and oriented to person, place, and time.     Coordination: Coordination normal.     ED Results / Procedures / Treatments   Labs (all labs ordered are listed, but only abnormal results are displayed) Labs Reviewed  BASIC METABOLIC PANEL  CBC WITH DIFFERENTIAL/PLATELET  URINALYSIS, ROUTINE W REFLEX MICROSCOPIC  EKG None  Radiology No results found.  Procedures Procedures    Medications Ordered in ED Medications  ondansetron (ZOFRAN) injection 4 mg (has no administration in time range)  morphine (PF) 4 MG/ML injection 4 mg (has no administration in time range)  ketorolac (TORADOL) 30 MG/ML injection 30 mg (has no administration in time range)    ED Course/ Medical Decision Making/ A&P  This patient presents to the ED for concern of severe left flank pain, this involves an extensive number of treatment options, and is a complaint that carries with it a high risk of complications and morbidity.  The differential diagnosis includes renal calculus, acute pyelonephritis, musculoskeletal etiology   Co  morbidities that complicate the patient evaluation  None   Additional history obtained:  No additional history or external records needed   Lab Tests:  I Ordered, and personally interpreted labs.  The pertinent results include: Unremarkable CBC and basic metabolic panel.  There is some microscopic hematuria noted on UA.   Imaging Studies ordered:  I ordered imaging studies including CT renal I independently visualized and interpreted imaging which showed no acute process I agree with the radiologist interpretation   Cardiac Monitoring: / EKG:  None performed   Consultations Obtained:  No consultations obtained or needed   Problem List / ED Course / Critical interventions / Medication management  Patient presenting here with complaints of severe left flank pain.  He has history of kidney stones and this feels the same.  He was sent for CT scan, but no obvious renal calculus was noted or obstructive uropathy.  He did have microscopic hematuria on his urinalysis and presentation could be consistent with past renal calculus.  Patient's pain was treated and seems to be feeling better.  With no emergent pathology identified, patient seems as though he can safely be discharged. I ordered medication including morphine and Toradol for pain and Zofran for nausea Reevaluation of the patient after these medicines showed that the patient improved I have reviewed the patients home medicines and have made adjustments as needed   Social Determinants of Health:  None   Test / Admission - Considered:  Patient to be discharged with pain medication, rest, and follow-up as needed no emergent pathology identified on CT scan that would warrant admission.  Final Clinical Impression(s) / ED Diagnoses Final diagnoses:  None    Rx / DC Orders ED Discharge Orders     None         Veryl Speak, MD 01/13/22 [phone]

## 2022-01-13 NOTE — ED Triage Notes (Signed)
Pt is/co left flank pain  Pt states the pain got bad about 30 minutes ago  Pt has hx of kidney stones and states this feels the same  Pt has nausea without vomiting

## 2022-01-17 ENCOUNTER — Emergency Department (HOSPITAL_BASED_OUTPATIENT_CLINIC_OR_DEPARTMENT_OTHER)
Admission: EM | Admit: 2022-01-17 | Discharge: 2022-01-17 | Disposition: A | Discharge status: Home / Self Care | Payer: BC Managed Care – PPO | Attending: Emergency Medicine | Admitting: Emergency Medicine

## 2022-01-17 ENCOUNTER — Encounter (HOSPITAL_BASED_OUTPATIENT_CLINIC_OR_DEPARTMENT_OTHER): Payer: Self-pay | Admitting: Emergency Medicine

## 2022-01-17 ENCOUNTER — Other Ambulatory Visit: Payer: Self-pay

## 2022-01-17 ENCOUNTER — Emergency Department (HOSPITAL_BASED_OUTPATIENT_CLINIC_OR_DEPARTMENT_OTHER): Payer: BC Managed Care – PPO

## 2022-01-17 DIAGNOSIS — R748 Abnormal levels of other serum enzymes: Secondary | ICD-10-CM | POA: Diagnosis not present

## 2022-01-17 DIAGNOSIS — R109 Unspecified abdominal pain: Secondary | ICD-10-CM

## 2022-01-17 DIAGNOSIS — R000 Tachycardia, unspecified: Secondary | ICD-10-CM | POA: Insufficient documentation

## 2022-01-17 DIAGNOSIS — D72829 Elevated white blood cell count, unspecified: Secondary | ICD-10-CM | POA: Insufficient documentation

## 2022-01-17 DIAGNOSIS — R112 Nausea with vomiting, unspecified: Secondary | ICD-10-CM | POA: Diagnosis not present

## 2022-01-17 DIAGNOSIS — R1032 Left lower quadrant pain: Secondary | ICD-10-CM | POA: Insufficient documentation

## 2022-01-17 DIAGNOSIS — I10 Essential (primary) hypertension: Secondary | ICD-10-CM | POA: Diagnosis not present

## 2022-01-17 LAB — CBC
HCT: 45.3 % (ref 39.0–52.0)
Hemoglobin: 15.4 g/dL (ref 13.0–17.0)
MCH: 32.8 pg (ref 26.0–34.0)
MCHC: 34 g/dL (ref 30.0–36.0)
MCV: 96.6 fL (ref 80.0–100.0)
Platelets: 281 10*3/uL (ref 150–400)
RBC: 4.69 MIL/uL (ref 4.22–5.81)
RDW: 11 % — ABNORMAL LOW (ref 11.5–15.5)
WBC: 12.5 10*3/uL — ABNORMAL HIGH (ref 4.0–10.5)
nRBC: 0 % (ref 0.0–0.2)

## 2022-01-17 LAB — COMPREHENSIVE METABOLIC PANEL
ALT: 94 U/L — ABNORMAL HIGH (ref 0–44)
AST: 65 U/L — ABNORMAL HIGH (ref 15–41)
Albumin: 4.1 g/dL (ref 3.5–5.0)
Alkaline Phosphatase: 70 U/L (ref 38–126)
Anion gap: 9 (ref 5–15)
BUN: 15 mg/dL (ref 6–20)
CO2: 22 mmol/L (ref 22–32)
Calcium: 9 mg/dL (ref 8.9–10.3)
Chloride: 104 mmol/L (ref 98–111)
Creatinine, Ser: 1.2 mg/dL (ref 0.61–1.24)
GFR, Estimated: >60 mL/min (ref >60)
Glucose, Bld: 241 mg/dL — ABNORMAL HIGH (ref 70–99)
Potassium: 4 mmol/L (ref 3.5–5.1)
Sodium: 135 mmol/L (ref 135–145)
Total Bilirubin: 1.2 mg/dL (ref 0.3–1.2)
Total Protein: 7.9 g/dL (ref 6.5–8.1)

## 2022-01-17 LAB — URINALYSIS, ROUTINE W REFLEX MICROSCOPIC
Bilirubin Urine: NEGATIVE
Glucose, UA: >=500 mg/dL — AB
Ketones, ur: NEGATIVE mg/dL
Leukocytes,Ua: NEGATIVE
Nitrite: NEGATIVE
Protein, ur: NEGATIVE mg/dL
Specific Gravity, Urine: 1.01 (ref 1.005–1.030)
pH: 5.5 (ref 5.0–8.0)

## 2022-01-17 LAB — URINALYSIS, MICROSCOPIC (REFLEX): WBC, UA: NONE SEEN WBC/hpf (ref 0–5)

## 2022-01-17 LAB — LIPASE, BLOOD: Lipase: 26 U/L (ref 11–51)

## 2022-01-17 MED ORDER — HYDROMORPHONE HCL 1 MG/ML IJ SOLN
1.0000 mg | Freq: Once | INTRAMUSCULAR | Status: AC
  Administered 2022-01-17: 1 mg via INTRAVENOUS
  Filled 2022-01-17: qty 1

## 2022-01-17 MED ORDER — ONDANSETRON HCL 4 MG/2ML IJ SOLN
4.0000 mg | Freq: Once | INTRAMUSCULAR | Status: AC
  Administered 2022-01-17: 4 mg via INTRAVENOUS
  Filled 2022-01-17: qty 2

## 2022-01-17 MED ORDER — IOHEXOL 300 MG/ML  SOLN
100.0000 mL | Freq: Once | INTRAMUSCULAR | Status: AC | PRN
Start: 2022-01-17 — End: 2022-01-17
  Administered 2022-01-17: 100 mL via INTRAVENOUS

## 2022-01-17 MED ORDER — OXYCODONE-ACETAMINOPHEN 5-325 MG PO TABS: 1.0000 | ORAL_TABLET | Freq: Four times a day (QID) | ORAL | 0 refills | Status: AC | PRN

## 2022-01-17 MED ORDER — SODIUM CHLORIDE 0.9 % IV BOLUS
1000.0000 mL | Freq: Once | INTRAVENOUS | Status: AC
  Administered 2022-01-17: 1000 mL via INTRAVENOUS

## 2022-01-17 MED ORDER — KETOROLAC TROMETHAMINE 15 MG/ML IJ SOLN
15.0000 mg | Freq: Once | INTRAMUSCULAR | Status: AC
  Administered 2022-01-17: 15 mg via INTRAVENOUS
  Filled 2022-01-17: qty 1

## 2022-01-17 MED ORDER — HYDROMORPHONE HCL 1 MG/ML IJ SOLN
0.5000 mg | Freq: Once | INTRAMUSCULAR | Status: AC
  Administered 2022-01-17: 0.5 mg via INTRAVENOUS
  Filled 2022-01-17: qty 1

## 2022-01-17 NOTE — ED Notes (Signed)
Patient transported to CT 

## 2022-01-17 NOTE — ED Triage Notes (Signed)
Patient presents to ED via POV from home. Here on Wednesday with hematuria, concerns for kidney stones. Returning today for worsening pain. Pale in color. Unable to sit still. Moaning in pain.

## 2022-01-17 NOTE — Discharge Instructions (Signed)
I have talked to Dr. Lovena Neighbours with urology.  He has asked for you to call the office at 8 AM tomorrow to schedule an appointment at [facility] for urgent follow-up tomorrow at some point.  I have Provided you pain medication for you to take as needed.

## 2022-01-17 NOTE — ED Provider Notes (Signed)
[facility]ENC[facility]PARTMENT Provider Note   CSN: [id number] Arrival date & time: 01/17/22  1233     History PMH: hemophilia, kidney stones Chief Complaint  Patient presents with   Flank Pain    Caleb Mckinney is a 46 y.o. male.  Patient presents the ED with severe left flank pain.  He says this has been present since last Wednesday.  He had associated hematuria.  In our ED and had a CT renal stone study that did not show any evidence of urolithiasis.  Patient states that he has continued to have severe pain that is actually worsened since he was seen on Wednesday.  Says the blood in his urine has worsened to the point where he is not able to urinate as well as he normally can.  Associated nausea and vomiting  Denies any fevers, chills, diarrhea, constipation, chest pain, shortness of breath, testicular symptoms, dysuria.   Flank Pain Associated symptoms include abdominal pain.       Home Medications Prior to Admission medications   Medication Sig Start Date End Date Taking? Authorizing Provider  oxyCODONE-acetaminophen (PERCOCET/ROXICET) 5-325 MG tablet Take 1 tablet by mouth every 6 (six) hours as needed for up to 5 days for severe pain. 01/17/22 01/22/22 Yes Koya Hunger, Adora Fridge, PA-C  HYDROcodone-acetaminophen (NORCO/VICODIN) 5-325 MG per tablet Take 1-2 tablets by mouth every 6 (six) hours as needed (for pain). 08/15/14   Molpus, John, MD  ibuprofen (ADVIL,MOTRIN) 200 MG tablet Take 400 mg by mouth every 6 (six) hours as needed. For pain     [provider]      Allergies    Patient has no known allergies.    Review of Systems   Review of Systems  Constitutional:  Negative for fever.  Gastrointestinal:  Positive for abdominal pain, nausea and vomiting.  Genitourinary:  Positive for difficulty urinating, flank pain and hematuria.  All other systems reviewed and are negative.   Physical Exam Updated Vital Signs BP (!) 142/115   Pulse 97   Temp  97.6 F (36.4 C)   Resp 14   SpO2 99%  Physical Exam Vitals and nursing note reviewed.  Constitutional:      General: He is not in acute distress.    Appearance: Normal appearance. He is well-developed. He is not ill-appearing, toxic-appearing or diaphoretic.  HENT:     Head: Normocephalic and atraumatic.     Nose: No nasal deformity.     Mouth/Throat:     Lips: Pink. No lesions.  Eyes:     General: Gaze aligned appropriately. No scleral icterus.       Right eye: No discharge.        Left eye: No discharge.     Conjunctiva/sclera: Conjunctivae normal.     Right eye: Right conjunctiva is not injected. No exudate or hemorrhage.    Left eye: Left conjunctiva is not injected. No exudate or hemorrhage. Pulmonary:     Effort: Pulmonary effort is normal. No respiratory distress.  Abdominal:     General: Abdomen is flat. There is no distension.     Palpations: Abdomen is soft.     Tenderness: There is abdominal tenderness. There is left CVA tenderness. There is no right CVA tenderness, guarding or rebound.     Comments: Severe left flank and LLQ pain  Musculoskeletal:     Right lower leg: No edema.     Left lower leg: No edema.  Skin:    General: Skin is warm  and dry.  Neurological:     Mental Status: He is alert and oriented to person, place, and time.  Psychiatric:        Mood and Affect: Mood normal.        Speech: Speech normal.        Behavior: Behavior normal. Behavior is cooperative.     ED Results / Procedures / Treatments   Labs (all labs ordered are listed, but only abnormal results are displayed) Labs Reviewed  COMPREHENSIVE METABOLIC PANEL - Abnormal; Notable for the following components:      Result Value   Glucose, Bld 241 (*)    AST 65 (*)    ALT 94 (*)    All other components within normal limits  CBC - Abnormal; Notable for the following components:   WBC 12.5 (*)    RDW 11.0 (*)    All other components within normal limits  URINALYSIS, ROUTINE W  REFLEX MICROSCOPIC - Abnormal; Notable for the following components:   Glucose, UA >=500 (*)    Hgb urine dipstick LARGE (*)    All other components within normal limits  URINALYSIS, MICROSCOPIC (REFLEX) - Abnormal; Notable for the following components:   Bacteria, UA RARE (*)    All other components within normal limits  LIPASE, BLOOD    EKG None  Radiology CT ABDOMEN PELVIS W CONTRAST  Result Date: 01/17/2022 CLINICAL DATA:  A 46 year old male presents with flank pain and suspected kidney stone. EXAM: CT ABDOMEN AND PELVIS WITH CONTRAST TECHNIQUE: Multidetector CT imaging of the abdomen and pelvis was performed using the standard protocol following bolus administration of intravenous contrast. RADIATION DOSE REDUCTION: This exam was performed according to the departmental dose-optimization program which includes automated exposure control, adjustment of the mA and/or kV according to patient size and/or use of iterative reconstruction technique. CONTRAST:  118m OMNIPAQUE IOHEXOL 300 MG/ML  SOLN COMPARISON:  January 13, 2022. FINDINGS: Lower chest: Minimal basilar atelectasis. No effusion. No consolidative changes. Hepatobiliary: No focal, suspicious hepatic lesion. No pericholecystic stranding. No biliary duct dilation. Portal vein is patent. Pancreas: Normal, without mass, inflammation or ductal dilatation. Spleen: Normal. Adrenals/Urinary Tract: Adrenal glands are normal. Delayed enhancement of the LEFT as compared to the RIGHT kidney with mild distension of the LEFT ureter and renal pelvis without frank hydronephrosis. Soft tissue density in the proximal LEFT ureter just below the LEFT UPJ (image 51/2) no calcific density along the course of the LEFT ureter. Urinary bladder is decompressed. Renal cortical scarring on the RIGHT as before. No suspicious renal lesions. Stomach/Bowel: No acute gastrointestinal findings. The appendix is normal. Vascular/Lymphatic: Aorta with smooth contours. IVC with  smooth contours. No aneurysmal dilation of the abdominal aorta. There is no gastrohepatic or hepatoduodenal ligament lymphadenopathy. No retroperitoneal or mesenteric lymphadenopathy. No pelvic sidewall lymphadenopathy. Reproductive: Unremarkable by CT. Other: No ascites. Musculoskeletal: No acute musculoskeletal findings. IMPRESSION: Signs of ureteral obstruction on the LEFT with soft tissue density in the proximal LEFT ureter. While there is not currently evidence of marked hydronephrosis there is mild collecting system and ureteral dilation and marked delayed enhancement of the LEFT as compared to the RIGHT kidney. Findings raise the question of urothelial neoplasm. Sloughed papilla or blood clot could also have a similar appearance. No ureteral calculus is noted. 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 RIGHT renal cortical scarring as before. Electronically Signed  By: Zetta Bills M.D.   On: 01/17/2022 15:01    Procedures Procedures   Medications Ordered in ED Medications  HYDROmorphone (DILAUDID) injection 1 mg (1 mg Intravenous Given 01/17/22 1332)  ondansetron (ZOFRAN) injection 4 mg (4 mg Intravenous Given 01/17/22 1331)  HYDROmorphone (DILAUDID) injection 1 mg (1 mg Intravenous Given 01/17/22 1417)  ketorolac (TORADOL) 15 MG/ML injection 15 mg (15 mg Intravenous Given 01/17/22 1417)  sodium chloride 0.9 % bolus 1,000 mL ( Intravenous Stopped 01/17/22 1540)  iohexol (OMNIPAQUE) 300 MG/ML solution 100 mL (100 mLs Intravenous Contrast Given 01/17/22 1424)  HYDROmorphone (DILAUDID) injection 0.5 mg (0.5 mg Intravenous Given 01/17/22 1640)    ED Course/ Medical Decision Making/ A&P Clinical Course as of 01/17/22 1810  Mon Jan 17, 2022  1732 Dr Lovena Neighbours will come evaluate CT and call me back  [GL]  [id number] [phone] [GL]    Clinical Course  User Index [GL] Sherre Poot Adora Fridge, PA-C                           Medical Decision Making Amount and/or Complexity of Data Reviewed Labs: ordered. Radiology: ordered.  Risk Prescription drug management.    MDM  This is a 46 y.o. male who presents to the ED with severe left flank pain The differential of this patient includes but is not limited to urolithiasis, Pyelonephritis, Renal infarct or thrombosis, renal hematoma 2/2 hemophilia, Urinary obstruction  Initial Impression  Patient is ill-appearing and is in a lot of pain right now.  He is hypertensive and minimally tachycardic.  He is afebrile He is presenting with continued left flank pain for 5 days.  He was last seen 5 days ago where he had a negative CT renal stone study ultimately discharged home.  Symptoms have not improved.  I discussed this case with my attending physician, and he recommends getting a contrasted abdomen/pelvis study  I personally ordered, reviewed, and interpreted all laboratory work and imaging and agree with radiologist interpretation. Results interpreted below:  WBC 12.5 Glucose 241, no acidosis, no anion gap, LFTS minimally elevated Lipase 26 UA with Lg Hgb, dumping glucose, rare bacteria CT renal stone study:  Signs of ureteral obstruction on the LEFT with soft tissue density in the proximal LEFT ureter. While there is not currently evidence of marked hydronephrosis there is mild collecting system and ureteral dilation and marked delayed enhancement of the LEFT as compared to the RIGHT kidney. Findings raise the question of urothelial neoplasm. Sloughed papilla or blood clot could also have a similar appearance. No ureteral calculus is noted. 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  RIGHT renal cortical scarring as  before.   Assessment/Plan:  Patient has signs of ureteral obstruction on the left with a soft tissue density in the proximal left ureter.  No evidence of hydronephrosis.  Per radiology read, findings raise question of urothelial neoplasm versus sloughed papilla versus blood clot.  No evidence of any ureteral calculus. Urine without any evidence of infection in the urinary tract, but he does have a mild leukocytosis.  We also did a bladder scan which showed 0 cc indicating that patient is not retaining any urine.  He does have notable hyperglycemia which has been evident on prior labs.  Does not have diabetes history, however I recommend that he get evaluated by PCP.  He  has not have any acidosis or signs of DKA and I do not feel that his symptoms today are representative of a hyperglycemic crisis.  '@1732'$ , I did speak with Dr. Lovena Neighbours from urology.  He has personally evaluated the CT and does not feel that this likely represents a malignancy and feels papilla or blood clot is more likely.  He states there is not a significant amount of hydronephrosis and he would recommend outpatient follow-up tomorrow morning if pain controlled.   I have reevaluated this patient, he still remains in pain after 3 doses of Dilaudid, as well as Toradol.  He does feel improved though, and states he is willing to try to go home with oral pain medication and will have follow-up with urology in the morning.  He is given strict return precautions if he is unable to tolerate his pain at home if he develops other concerning symptoms.   Charting Requirements Additional history is obtained from:  Independent historian External Records from outside source obtained and reviewed including: Recent CT scan Social Determinants of Health:  none Pertinant PMH that complicates patient's illness: hx of renal stones  Patient Care Problems that were addressed during this visit: - Left Flank pain: Acute illness with complication This  patient was maintained on a cardiac monitor/telemetry. I personally viewed and interpreted the cardiac monitor which reveals an underlying rhythm of NSR Medications given in ED: Dilaudid x 3, Toradol, Zofran, IVF Reevaluation of the patient after these medicines showed that the patient improved I have reviewed home medications and made changes accordingly.  Critical Care Interventions: n/a Consultations: Urology Disposition: Discharge  This is a supervised visit with my attending physician, Dr. Pearline Cables. We have discussed this patient and they have altered the plan as needed.  Portions of this note were generated with [employer]. Dictation errors may occur despite best attempts at proofreading.      Final Clinical Impression(s) / ED Diagnoses Final diagnoses:  Left flank pain    Rx / DC Orders ED Discharge Orders          Ordered    oxyCODONE-acetaminophen (PERCOCET/ROXICET) 5-325 MG tablet  Every 6 hours PRN        01/17/22 1747              Adolphus Birchwood, PA-C 01/17/22 1810    Jeanell Sparrow, DO 01/19/22 [phone]

## 2022-01-18 ENCOUNTER — Other Ambulatory Visit: Payer: Self-pay | Admitting: Urology

## 2022-01-18 ENCOUNTER — Ambulatory Visit (HOSPITAL_COMMUNITY)
Admission: AD | Admit: 2022-01-18 | Discharge: 2022-01-18 | Disposition: A | Discharge status: Home / Self Care | Payer: BC Managed Care – PPO | Source: Ambulatory Visit | Attending: Urology | Admitting: Urology

## 2022-01-18 ENCOUNTER — Other Ambulatory Visit: Payer: Self-pay

## 2022-01-18 ENCOUNTER — Encounter (HOSPITAL_COMMUNITY)
Admission: AD | Disposition: A | Discharge status: Home / Self Care | Payer: Self-pay | Source: Ambulatory Visit | Attending: Urology

## 2022-01-18 ENCOUNTER — Encounter (HOSPITAL_COMMUNITY): Payer: Self-pay | Admitting: Urology

## 2022-01-18 ENCOUNTER — Inpatient Hospital Stay (HOSPITAL_COMMUNITY): Payer: BC Managed Care – PPO | Admitting: Anesthesiology

## 2022-01-18 ENCOUNTER — Inpatient Hospital Stay (HOSPITAL_COMMUNITY): Payer: BC Managed Care – PPO

## 2022-01-18 DIAGNOSIS — Z6831 Body mass index (BMI) 31.0-31.9, adult: Secondary | ICD-10-CM | POA: Diagnosis not present

## 2022-01-18 DIAGNOSIS — C680 Malignant neoplasm of urethra: Secondary | ICD-10-CM | POA: Diagnosis not present

## 2022-01-18 DIAGNOSIS — D66 Hereditary factor VIII deficiency: Secondary | ICD-10-CM | POA: Insufficient documentation

## 2022-01-18 DIAGNOSIS — Q6211 Congenital occlusion of ureteropelvic junction: Principal | ICD-10-CM

## 2022-01-18 DIAGNOSIS — E669 Obesity, unspecified: Secondary | ICD-10-CM | POA: Insufficient documentation

## 2022-01-18 DIAGNOSIS — N1330 Unspecified hydronephrosis: Secondary | ICD-10-CM | POA: Diagnosis present

## 2022-01-18 HISTORY — PX: CYSTOSCOPY W/ URETERAL STENT PLACEMENT: SHX1429

## 2022-01-18 HISTORY — DX: Personal history of urinary calculi: Z87.442

## 2022-01-18 LAB — GLUCOSE, CAPILLARY: Glucose-Capillary: 176 mg/dL — ABNORMAL HIGH (ref 70–99)

## 2022-01-18 SURGERY — CYSTOSCOPY, WITH RETROGRADE PYELOGRAM AND URETERAL STENT INSERTION
Anesthesia: General | Laterality: Left

## 2022-01-18 MED ORDER — MEPERIDINE HCL 50 MG/ML IJ SOLN: 6.2500 mg | INTRAMUSCULAR | Status: DC | PRN

## 2022-01-18 MED ORDER — PROPOFOL 10 MG/ML IV BOLUS
INTRAVENOUS | Status: DC | PRN
  Administered 2022-01-18: 200 mg via INTRAVENOUS

## 2022-01-18 MED ORDER — OXYCODONE HCL 5 MG/5ML PO SOLN: 5.0000 mg | Freq: Once | ORAL | Status: AC | PRN

## 2022-01-18 MED ORDER — OXYCODONE HCL 5 MG PO TABS
ORAL_TABLET | ORAL | Status: AC
  Filled 2022-01-18: qty 1

## 2022-01-18 MED ORDER — FENTANYL CITRATE (PF) 100 MCG/2ML IJ SOLN
INTRAMUSCULAR | Status: AC
  Filled 2022-01-18: qty 2

## 2022-01-18 MED ORDER — HYDROMORPHONE HCL 1 MG/ML IJ SOLN
0.2500 mg | INTRAMUSCULAR | Status: DC | PRN
  Administered 2022-01-18: 0.25 mg via INTRAVENOUS

## 2022-01-18 MED ORDER — MIDAZOLAM HCL 2 MG/2ML IJ SOLN: 0.5000 mg | Freq: Once | INTRAMUSCULAR | Status: DC | PRN

## 2022-01-18 MED ORDER — CHLORHEXIDINE GLUCONATE 0.12 % MT SOLN
15.0000 mL | Freq: Once | OROMUCOSAL | Status: AC
  Administered 2022-01-18: 15 mL via OROMUCOSAL

## 2022-01-18 MED ORDER — IOHEXOL 300 MG/ML  SOLN
INTRAMUSCULAR | Status: DC | PRN
  Administered 2022-01-18: 10 mL via URETHRAL

## 2022-01-18 MED ORDER — ONDANSETRON HCL 4 MG/2ML IJ SOLN
INTRAMUSCULAR | Status: DC | PRN
  Administered 2022-01-18: 4 mg via INTRAVENOUS

## 2022-01-18 MED ORDER — TAMSULOSIN HCL 0.4 MG PO CAPS
0.4000 mg | ORAL_CAPSULE | Freq: Every day | ORAL | 0 refills | Status: DC
Start: 2022-01-18 — End: 2022-11-10

## 2022-01-18 MED ORDER — ONDANSETRON HCL 4 MG/2ML IJ SOLN
INTRAMUSCULAR | Status: AC
  Filled 2022-01-18: qty 2

## 2022-01-18 MED ORDER — PROMETHAZINE HCL 25 MG/ML IJ SOLN: 6.2500 mg | INTRAMUSCULAR | Status: DC | PRN

## 2022-01-18 MED ORDER — PHENYLEPHRINE 80 MCG/ML (10ML) SYRINGE FOR IV PUSH (FOR BLOOD PRESSURE SUPPORT)
PREFILLED_SYRINGE | INTRAVENOUS | Status: AC
Start: 2022-01-18 — End: ?
  Filled 2022-01-18: qty 10

## 2022-01-18 MED ORDER — CEFAZOLIN SODIUM-DEXTROSE 2-4 GM/100ML-% IV SOLN
2.0000 g | INTRAVENOUS | Status: AC
  Administered 2022-01-18: 2 g via INTRAVENOUS
  Filled 2022-01-18: qty 100

## 2022-01-18 MED ORDER — MIDAZOLAM HCL 5 MG/5ML IJ SOLN
INTRAMUSCULAR | Status: DC | PRN
  Administered 2022-01-18: 2 mg via INTRAVENOUS

## 2022-01-18 MED ORDER — FENTANYL CITRATE PF 50 MCG/ML IJ SOSY
50.0000 ug | PREFILLED_SYRINGE | Freq: Once | INTRAMUSCULAR | Status: AC
  Administered 2022-01-18: 50 ug via INTRAVENOUS

## 2022-01-18 MED ORDER — DEXAMETHASONE SODIUM PHOSPHATE 10 MG/ML IJ SOLN
INTRAMUSCULAR | Status: DC | PRN
  Administered 2022-01-18: 4 mg via INTRAVENOUS

## 2022-01-18 MED ORDER — OXYCODONE HCL 5 MG PO TABS
5.0000 mg | ORAL_TABLET | Freq: Once | ORAL | Status: AC | PRN
  Administered 2022-01-18: 5 mg via ORAL

## 2022-01-18 MED ORDER — PHENYLEPHRINE 80 MCG/ML (10ML) SYRINGE FOR IV PUSH (FOR BLOOD PRESSURE SUPPORT)
PREFILLED_SYRINGE | INTRAVENOUS | Status: DC | PRN
  Administered 2022-01-18: 80 ug via INTRAVENOUS

## 2022-01-18 MED ORDER — PROPOFOL 10 MG/ML IV BOLUS
INTRAVENOUS | Status: AC
Start: 2022-01-18 — End: ?
  Filled 2022-01-18: qty 20

## 2022-01-18 MED ORDER — SODIUM CHLORIDE 0.9 % IR SOLN
Status: DC | PRN
  Administered 2022-01-18: 3000 mL via INTRAVESICAL

## 2022-01-18 MED ORDER — LIDOCAINE 2% (20 MG/ML) 5 ML SYRINGE
INTRAMUSCULAR | Status: DC | PRN
  Administered 2022-01-18: 40 mg via INTRAVENOUS

## 2022-01-18 MED ORDER — FENTANYL CITRATE PF 50 MCG/ML IJ SOSY
PREFILLED_SYRINGE | INTRAMUSCULAR | Status: AC
  Filled 2022-01-18: qty 1

## 2022-01-18 MED ORDER — PROPOFOL 10 MG/ML IV BOLUS
INTRAVENOUS | Status: AC
  Filled 2022-01-18: qty 20

## 2022-01-18 MED ORDER — LACTATED RINGERS IV SOLN: INTRAVENOUS | Status: DC

## 2022-01-18 MED ORDER — DEXAMETHASONE SODIUM PHOSPHATE 10 MG/ML IJ SOLN
INTRAMUSCULAR | Status: AC
  Filled 2022-01-18: qty 1

## 2022-01-18 MED ORDER — LIDOCAINE 2% (20 MG/ML) 5 ML SYRINGE
INTRAMUSCULAR | Status: AC
  Filled 2022-01-18: qty 5

## 2022-01-18 MED ORDER — ACETAMINOPHEN 500 MG PO TABS
1000.0000 mg | ORAL_TABLET | Freq: Once | ORAL | Status: AC
  Administered 2022-01-18: 1000 mg via ORAL
  Filled 2022-01-18: qty 2

## 2022-01-18 MED ORDER — FENTANYL CITRATE (PF) 100 MCG/2ML IJ SOLN
INTRAMUSCULAR | Status: DC | PRN
  Administered 2022-01-18: 100 ug via INTRAVENOUS

## 2022-01-18 MED ORDER — HYDROMORPHONE HCL 1 MG/ML IJ SOLN
INTRAMUSCULAR | Status: AC
  Filled 2022-01-18: qty 1

## 2022-01-18 MED ORDER — MIDAZOLAM HCL 2 MG/2ML IJ SOLN
INTRAMUSCULAR | Status: AC
  Filled 2022-01-18: qty 2

## 2022-01-18 SURGICAL SUPPLY — 20 items
BAG URO CATCHER STRL LF (MISCELLANEOUS) ×1 IMPLANT
BASKET ZERO TIP NITINOL 2.4FR (BASKET) IMPLANT
CATH URETL OPEN 5X70 (CATHETERS) ×1 IMPLANT
CLOTH BEACON ORANGE TIMEOUT ST (SAFETY) ×1 IMPLANT
EXTRACTOR STONE 1.7FRX115CM (UROLOGICAL SUPPLIES) IMPLANT
GLOVE SURG LX 7.5 STRW (GLOVE) ×1
GLOVE SURG LX STRL 7.5 STRW (GLOVE) ×1 IMPLANT
GOWN STRL REUS W/ TWL LRG LVL3 (GOWN DISPOSABLE) ×2 IMPLANT
GOWN STRL REUS W/ TWL XL LVL3 (GOWN DISPOSABLE) ×1 IMPLANT
GOWN STRL REUS W/TWL LRG LVL3 (GOWN DISPOSABLE) ×2
GOWN STRL REUS W/TWL XL LVL3 (GOWN DISPOSABLE) ×1
GUIDEWIRE ANG ZIPWIRE 038X150 (WIRE) IMPLANT
GUIDEWIRE STR DUAL SENSOR (WIRE) ×1 IMPLANT
MANIFOLD NEPTUNE II (INSTRUMENTS) ×1 IMPLANT
PACK CYSTO (CUSTOM PROCEDURE TRAY) ×1 IMPLANT
SHEATH NAVIGATOR HD 11/13X28 (SHEATH) IMPLANT
SHEATH NAVIGATOR HD 11/13X36 (SHEATH) IMPLANT
STENT URET 6FRX28 CONTOUR (STENTS) IMPLANT
TUBING CONNECTING 10 (TUBING) ×1 IMPLANT
TUBING UROLOGY SET (TUBING) ×1 IMPLANT

## 2022-01-18 NOTE — Anesthesia Preprocedure Evaluation (Addendum)
Anesthesia Evaluation  Patient identified by MRN, date of birth, ID band Patient awake    Reviewed: Allergy & Precautions, NPO status , Patient's Chart, lab work & pertinent test results  History of Anesthesia Complications Negative for: history of anesthetic complications  Airway Mallampati: II  TM Distance: >3 FB Neck ROM: Full    Dental  (+) Dental Advisory Given   Pulmonary neg pulmonary ROS,    breath sounds clear to auscultation       Cardiovascular negative cardio ROS   Rhythm:Regular Rate:Normal     Neuro/Psych negative neurological ROS     GI/Hepatic Neg liver ROS, Elevated LFTs N/V with this kidney stone   Endo/Other  Obese Elevated fasting blood sugar: glu 174 today   Renal/GU stones     Musculoskeletal   Abdominal (+) + obese,   Peds  Hematology Hemophilia A: Factor VIII deficiency, 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    Anesthesia Other Findings   Reproductive/Obstetrics                          Anesthesia Physical Anesthesia Plan  ASA: 3  Anesthesia Plan: General   Post-op Pain Management: Tylenol PO (pre-op)*   Induction: Intravenous  PONV Risk Score and Plan: 2 and Ondansetron and Dexamethasone  Airway Management Planned: Oral ETT  Additional Equipment: None  Intra-op Plan:   Post-operative Plan: Extubation in OR  Informed Consent: I have reviewed the patients History and Physical, chart, labs and discussed the procedure including the risks, benefits and alternatives for the proposed anesthesia with the patient or authorized representative who has indicated his/her understanding and acceptance.     Dental advisory given  Plan Discussed with: CRNA and Surgeon  Anesthesia Plan Comments: (Pt to follow-up diabetes eval with primary care MD)      Anesthesia Quick Evaluation

## 2022-01-18 NOTE — H&P (Signed)
46 year old male, otherwise reasonably healthy, presents today for 10 days of left-sided flank pain. The patient has a history of nephrolithiasis, and was seen in the emergency department thinking that he had a stone. He subsequently had a CT scan that demonstrated no stone, but someSoft tissue density in the left proximal ureter with associated proximal hydroureteronephrosis. Over the last 10 days the patient's pain has been intermittent, but mostly quite severe. He has been to the emergency department twice because of this. He has some gross hematuria. He denies any fevers or chills. He is not having any associated nausea. He has been managing his pain with Percocet and ibuprofen.   The patient is otherwise reasonably healthy with no significant past medical history.     ALLERGIES: None   MEDICATIONS: None   GU PSH: None   NON-GU PSH: Inguinal Hernia Repair > 5 yrs, 1983     GU PMH: None     PMH Notes: Bleeding disorder   NON-GU PMH: GERD Hypertension    FAMILY HISTORY: None    Notes: 2 sons; 5 daughters   SOCIAL HISTORY: Marital Status: Married Race: White Current Smoking Status: Patient has never smoked.   Tobacco Use Assessment Completed: Used Tobacco in last 30 days? Social Drinker.  Drinks 2 caffeinated drinks per day. Patient's occupation [place].    REVIEW OF SYSTEMS:    GU Review Male:   Patient reports get up at night to urinate. Patient denies frequent urination, hard to postpone urination, burning/ pain with urination, leakage of urine, stream starts and stops, trouble starting your stream, have to strain to urinate , erection problems, and penile pain.  Gastrointestinal (Upper):   Patient reports vomiting and nausea. Patient denies indigestion/ heartburn.  Gastrointestinal (Lower):   Patient denies diarrhea and constipation.  Constitutional:   Patient reports fatigue. Patient denies fever, night sweats, and weight loss.  Skin:    Patient denies skin rash/ lesion and itching.  Eyes:   Patient denies blurred vision and double vision.  Ears/ Nose/ Throat:   Patient denies sore throat and sinus problems.  Hematologic/Lymphatic:   Patient denies swollen glands and easy bruising.  Cardiovascular:   Patient denies leg swelling and chest pains.  Respiratory:   Patient denies cough and shortness of breath.  Endocrine:   Patient denies excessive thirst.  Musculoskeletal:   Patient reports back pain. Patient denies joint pain.  Neurological:   Patient denies headaches and dizziness.  Psychologic:   Patient denies depression and anxiety.   Notes: h/o of injury to kidneys/bladder    VITAL SIGNS:      01/18/2022 10:34 AM  Weight 260 lb / 117.93 kg  Height 76 in / 193.04 cm  BP 147/104 mmHg  Pulse 96 /min  Temperature 97.1 F / 36.1 C  BMI 31.6 kg/m   MULTI-SYSTEM PHYSICAL EXAMINATION:    Constitutional: Well-nourished. No physical deformities. Normally developed. Good grooming.  Neck: Neck symmetrical, not swollen. Normal tracheal position.  Respiratory: Normal breath sounds. No labored breathing, no use of accessory muscles.   Cardiovascular: Regular rate and rhythm. No murmur, no gallop. Normal temperature, normal extremity pulses, no swelling, no varicosities.   Lymphatic: No enlargement of neck, axillae, groin.  Skin: No paleness, no jaundice, no cyanosis. No lesion, no ulcer, no rash.  Neurologic / Psychiatric: Oriented to time, oriented to place, oriented to person. No depression, no anxiety, no agitation.  Gastrointestinal: No mass, no tenderness, no rigidity, non obese abdomen.  Eyes: Normal  conjunctivae. Normal eyelids.  Ears, Nose, Mouth, and Throat: Left ear no scars, no lesions, no masses. Right ear no scars, no lesions, no masses. Nose no scars, no lesions, no masses. Normal hearing. Normal lips.  Musculoskeletal: Normal gait and station of head and neck.     Complexity of Data:  Source Of History:   Patient  Records Review:   Previous Doctor Records, Previous Patient Records, POC Tool  Urine Test Review:   Urinalysis  X-Ray Review: C.T. Abdomen/Pelvis: Reviewed Films. Discussed With Patient.     PROCEDURES: None   ASSESSMENT:      ICD-10 Details  1 GU:   Hydronephrosis - N13.0    PLAN:           Document Letter(s):  Created for Patient: Clinical Summary         Notes:   The patient has poorly controlled left-sided flank pain, gross hematuria, and a filling defect in the left proximal ureter. Differential diagnosis is fungal ball, sloughed papilla, or ureteral cancer. At this point, the patient's been to the ER several times, has very poorly controlled pain. As a result, I recommended that we proceed to the operating room for diagnostic ureteroscopy, and stent placement. I went through the surgery with the patient detail, plan is to add him on to the OR schedule at the end of the day. Having gone through all of the options and associated risk and benefits he did agreed to proceed with ureteroscopy and stent placement tonight.

## 2022-01-18 NOTE — Anesthesia Postprocedure Evaluation (Signed)
Anesthesia Post Note  Patient: Caleb Mckinney  Procedure(s) Performed: CYSTOSCOPY WITH RETROGRADE PYELOGRAM/URETERAL STENT PLACEMENT, URETERAL BIOPSY (Left)     Patient location during evaluation: PACU Anesthesia Type: General Level of consciousness: awake and alert, patient cooperative and oriented Pain management: pain level controlled Vital Signs Assessment: post-procedure vital signs reviewed and stable Respiratory status: spontaneous breathing, nonlabored ventilation and respiratory function stable Cardiovascular status: blood pressure returned to baseline and stable Postop Assessment: no apparent nausea or vomiting, adequate PO intake and able to ambulate Anesthetic complications: no   No notable events documented.  Last Vitals:  Vitals:   01/18/22 2215 01/18/22 2230  BP: (!) 150/112 (!) 151/116  Pulse: 93 91  Resp: 17 13  Temp:    SpO2: 95% 96%    Last Pain:  Vitals:   01/18/22 2215  TempSrc:   PainSc: 6                  Yasenia Reedy,E. Pritesh Sobecki

## 2022-01-18 NOTE — Anesthesia Procedure Notes (Signed)
Procedure Name: LMA Insertion Date/Time: 01/18/2022 8:48 PM  Performed by: Montel Clock, CRNAPre-anesthesia Checklist: Patient identified, Emergency Drugs available, Suction available, Patient being monitored and Timeout performed Patient Re-evaluated:Patient Re-evaluated prior to induction Oxygen Delivery Method: Circle system utilized Preoxygenation: Pre-oxygenation with 100% oxygen Induction Type: IV induction LMA: LMA with gastric port inserted LMA Size: 5.0 Number of attempts: 1 Dental Injury: Teeth and Oropharynx as per pre-operative assessment

## 2022-01-18 NOTE — Op Note (Signed)
Preoperative diagnosis:  Left hydroureteronephrosis  Postoperative diagnosis:  Same  Procedure: Cystoscopy, left retrograde pyelogram with interpretation Left diagnostic ureteroscopy with biopsy Left ureteral stent placement  Surgeon: Ardis Hughs, MD  Anesthesia: General  Complications: None  Intraoperative findings:  #1: The patient's retrograde pyelogram was normal and the distal ureter.  However, in the proximal ureter and the UVJ there was stippling and hydronephrosis but no discrete filling defect.  There was poor filling of the renal pelvis itself.   #2: Ureteroscopy demonstrated some erythematous bullous edema starting in the area of the proximal ureter/UPJ.  I did biopsy these areas as well as sent a urine cytology.  EBL: Minimal  Specimens:  #1: Urine cytology, left renal pelvis #2: Left UPJ ureteral biopsy  Indication: Caleb Mckinney is a 46 y.o. patient with 10 days of significant left flank pain and hematuria.  He was seen in the emergency department had a CAT scan demonstrating proximal hydroureteronephrosis with no stone or clear sign of obstruction..  After reviewing the management options for treatment, he elected to proceed with the above surgical procedure(s). We have discussed the potential benefits and risks of the procedure, side effects of the proposed treatment, the likelihood of the patient achieving the goals of the procedure, and any potential problems that might occur during the procedure or recuperation. Informed consent has been obtained.  Description of procedure:  The patient was taken to the operating room and general anesthesia was induced.  The patient was placed in the dorsal lithotomy position, prepped and draped in the usual sterile fashion, and preoperative antibiotics were administered. A preoperative time-out was performed.   21 French 30 degree cystoscope was gently passed to the patient's urethra and the bladder under vision guidance.   Cystoscopy was performed demonstrating a normal bladder mucosa, there was some blood within the bladder, but this appeared to be emanating from the patient's left ureteral orifice.  The ureteral orifice ease were orthotopic.  I then performed a retrograde pyelogram by cannulating the left ureteral orifice with a 5 [place] open-ended ureteral catheter with the above findings.  Subsequently advanced a wire through the open-ended catheter and advanced up into the renal pelvis removing the catheter over the wire.  I then emptied the patient's bladder using a 6 French long semirigid ureteroscope I was able to navigate up through the patient's bladder and into the left ureter and finally up into the left proximal ureter under visual guidance.  The above findings were then noted.  I subsequently barbotaged the urine in this area and sent that for urine cytology.  I then used a long flexible ureteral biopsy forceps and attempted to biopsy the area 4 separate times.  There was scant specimen, but I did send that for pathology.  I then slowly backed out the ureteroscope and exchanged it for the cystoscope, backloaded the wire through the scope.  I subsequently advanced a 28 cm time 6 French double-J stent up through the patient's left ureter and into the renal pelvis under visual guidance.  Once the stent was noted to be well within the renal pelvis I advanced it up to the bladder neck before removing the wire.  A nice curl was noted within the renal pelvis and bladder.  The bladder subsequently emptied and he was returned the PACU in stable condition.  Disposition: The patient will be scheduled for follow-up in the next several weeks for further discussion and reevaluation.  Ardis Hughs, M.D.

## 2022-01-18 NOTE — H&P (View-Only) (Signed)
46 year old male, otherwise reasonably healthy, presents today for 10 days of left-sided flank pain. The patient has a history of nephrolithiasis, and was seen in the emergency department thinking that he had a stone. He subsequently had a CT scan that demonstrated no stone, but someSoft tissue density in the left proximal ureter with associated proximal hydroureteronephrosis. Over the last 10 days the patient's pain has been intermittent, but mostly quite severe. He has been to the emergency department twice because of this. He has some gross hematuria. He denies any fevers or chills. He is not having any associated nausea. He has been managing his pain with Percocet and ibuprofen.   The patient is otherwise reasonably healthy with no significant past medical history.     ALLERGIES: None   MEDICATIONS: None   GU PSH: None   NON-GU PSH: Inguinal Hernia Repair > 5 yrs, 1983     GU PMH: None     PMH Notes: Bleeding disorder   NON-GU PMH: GERD Hypertension    FAMILY HISTORY: None    Notes: 2 sons; 5 daughters   SOCIAL HISTORY: Marital Status: Married Race: White Current Smoking Status: Patient has never smoked.   Tobacco Use Assessment Completed: Used Tobacco in last 30 days? Social Drinker.  Drinks 2 caffeinated drinks per day. Patient's occupation [employer].    REVIEW OF SYSTEMS:    GU Review Male:   Patient reports get up at night to urinate. Patient denies frequent urination, hard to postpone urination, burning/ pain with urination, leakage of urine, stream starts and stops, trouble starting your stream, have to strain to urinate , erection problems, and penile pain.  Gastrointestinal (Upper):   Patient reports vomiting and nausea. Patient denies indigestion/ heartburn.  Gastrointestinal (Lower):   Patient denies diarrhea and constipation.  Constitutional:   Patient reports fatigue. Patient denies fever, night sweats, and weight loss.  Skin:    Patient denies skin rash/ lesion and itching.  Eyes:   Patient denies blurred vision and double vision.  Ears/ Nose/ Throat:   Patient denies sore throat and sinus problems.  Hematologic/Lymphatic:   Patient denies swollen glands and easy bruising.  Cardiovascular:   Patient denies leg swelling and chest pains.  Respiratory:   Patient denies cough and shortness of breath.  Endocrine:   Patient denies excessive thirst.  Musculoskeletal:   Patient reports back pain. Patient denies joint pain.  Neurological:   Patient denies headaches and dizziness.  Psychologic:   Patient denies depression and anxiety.   Notes: h/o of injury to kidneys/bladder    VITAL SIGNS:      01/18/2022 10:34 AM  Weight 260 lb / 117.93 kg  Height 76 in / 193.04 cm  BP 147/104 mmHg  Pulse 96 /min  Temperature 97.1 F / 36.1 C  BMI 31.6 kg/m   MULTI-SYSTEM PHYSICAL EXAMINATION:    Constitutional: Well-nourished. No physical deformities. Normally developed. Good grooming.  Neck: Neck symmetrical, not swollen. Normal tracheal position.  Respiratory: Normal breath sounds. No labored breathing, no use of accessory muscles.   Cardiovascular: Regular rate and rhythm. No murmur, no gallop. Normal temperature, normal extremity pulses, no swelling, no varicosities.   Lymphatic: No enlargement of neck, axillae, groin.  Skin: No paleness, no jaundice, no cyanosis. No lesion, no ulcer, no rash.  Neurologic / Psychiatric: Oriented to time, oriented to place, oriented to person. No depression, no anxiety, no agitation.  Gastrointestinal: No mass, no tenderness, no rigidity, non obese abdomen.  Eyes: Normal  conjunctivae. Normal eyelids.  Ears, Nose, Mouth, and Throat: Left ear no scars, no lesions, no masses. Right ear no scars, no lesions, no masses. Nose no scars, no lesions, no masses. Normal hearing. Normal lips.  Musculoskeletal: Normal gait and station of head and neck.     Complexity of Data:  Source Of History:   Patient  Records Review:   Previous Doctor Records, Previous Patient Records, POC Tool  Urine Test Review:   Urinalysis  X-Ray Review: C.T. Abdomen/Pelvis: Reviewed Films. Discussed With Patient.     PROCEDURES: None   ASSESSMENT:      ICD-10 Details  1 GU:   Hydronephrosis - N13.0    PLAN:           Document Letter(s):  Created for Patient: Clinical Summary         Notes:   The patient has poorly controlled left-sided flank pain, gross hematuria, and a filling defect in the left proximal ureter. Differential diagnosis is fungal ball, sloughed papilla, or ureteral cancer. At this point, the patient's been to the ER several times, has very poorly controlled pain. As a result, I recommended that we proceed to the operating room for diagnostic ureteroscopy, and stent placement. I went through the surgery with the patient detail, plan is to add him on to the OR schedule at the end of the day. Having gone through all of the options and associated risk and benefits he did agreed to proceed with ureteroscopy and stent placement tonight.

## 2022-01-18 NOTE — Discharge Instructions (Signed)
DISCHARGE INSTRUCTIONS FOR KIDNEY STONE/URETERAL STENT   MEDICATIONS:  1. Resume all your other meds from home.  2. Tamsulosin daily for stent irritation and spasm. 3. Take pain medication already prescribed for moderate/severe pain, otherwise taking upto 1000 mg every 6 hours of plain Tylenol will help treat your pain.     ACTIVITY:  1. No strenuous activity x 1week  2. No driving while on narcotic pain medications  3. Drink plenty of water  4. Continue to walk at home - you can still get blood clots when you are at home, so keep active, but don't over do it.  5. May return to work/school tomorrow or when you feel ready   BATHING:  1. You can shower and we recommend daily showers    SIGNS/SYMPTOMS TO CALL:  Please call us if you have a fever greater than 101.5, uncontrolled nausea/vomiting, uncontrolled pain, dizziness, unable to urinate, bloody urine, chest pain, shortness of breath, leg swelling, leg pain, redness around wound, drainage from wound, or any other concerns or questions.   You can reach [place] at [phone].   FOLLOW-UP:  1. You will be scheduled for 2 weeks for further discussion with Dr. Louis Meckel.

## 2022-01-18 NOTE — Transfer of Care (Signed)
Immediate Anesthesia Transfer of Care Note  Patient: Caleb Mckinney  Procedure(s) Performed: CYSTOSCOPY WITH RETROGRADE PYELOGRAM/URETERAL STENT PLACEMENT, URETERAL BIOPSY (Left)  Patient Location: PACU  Anesthesia Type:General  Level of Consciousness: drowsy and patient cooperative  Airway & Oxygen Therapy: Patient Spontanous Breathing and Patient connected to face mask oxygen  Post-op Assessment: Report given to RN and Post -op Vital signs reviewed and stable  Post vital signs: Reviewed and stable  Last Vitals:  Vitals Value Taken Time  BP 148/106 01/18/22 2141  Temp    Pulse 91 01/18/22 2143  Resp 12 01/18/22 2143  SpO2 100 % 01/18/22 2143  Vitals shown include unvalidated device data.  Last Pain:  Vitals:   01/18/22 1731  TempSrc: Oral  PainSc: 7          Complications: No notable events documented.

## 2022-01-19 ENCOUNTER — Encounter (HOSPITAL_COMMUNITY): Payer: Self-pay | Admitting: Urology

## 2022-01-27 ENCOUNTER — Other Ambulatory Visit: Payer: Self-pay | Admitting: Urology

## 2022-01-27 ENCOUNTER — Encounter (HOSPITAL_BASED_OUTPATIENT_CLINIC_OR_DEPARTMENT_OTHER): Payer: Self-pay | Admitting: Urology

## 2022-01-27 ENCOUNTER — Other Ambulatory Visit: Payer: Self-pay

## 2022-01-27 NOTE — Progress Notes (Signed)
Spoke w/ via phone for pre-op interview---pt  Lab needs dos---- I-stat, CBG             Lab results------EKG in epic 12/23/2021 COVID test -----patient states asymptomatic no test needed Arrive at -------1000 NPO after MN NO Solid Food.  Clear liquids from MN until---0900 Med rec completed Medications to take morning of surgery -----flomax Diabetic medication -----none Patient instructed no nail polish to be worn day of surgery Patient instructed to bring photo id and insurance card day of surgery Patient aware to have Driver (ride ) / caregiver wife Caleb Mckinney    for 24 hours after surgery  Patient Special Instructions -----don't take percocet day of surgery Pre-Op special Istructions ----- Patient verbalized understanding of instructions that were given at this phone interview. Patient denies shortness of breath, chest pain, fever, cough at this phone interview.     Pt hasn't been formally Dx by Dr. For type 2 DM or HTN, but due to recent ED and surgery BP has been elevated and Blood glucose too.

## 2022-02-11 ENCOUNTER — Ambulatory Visit (HOSPITAL_BASED_OUTPATIENT_CLINIC_OR_DEPARTMENT_OTHER): Payer: Self-pay | Admitting: Anesthesiology

## 2022-02-11 ENCOUNTER — Encounter (HOSPITAL_BASED_OUTPATIENT_CLINIC_OR_DEPARTMENT_OTHER)
Admission: RE | Disposition: A | Discharge status: Home / Self Care | Payer: Self-pay | Source: Home / Self Care | Attending: Urology

## 2022-02-11 ENCOUNTER — Encounter (HOSPITAL_BASED_OUTPATIENT_CLINIC_OR_DEPARTMENT_OTHER): Payer: Self-pay | Admitting: Urology

## 2022-02-11 ENCOUNTER — Ambulatory Visit (HOSPITAL_BASED_OUTPATIENT_CLINIC_OR_DEPARTMENT_OTHER)
Admission: RE | Admit: 2022-02-11 | Discharge: 2022-02-11 | Disposition: A | Discharge status: Home / Self Care | Payer: Self-pay | Attending: Urology | Admitting: Urology

## 2022-02-11 DIAGNOSIS — C680 Malignant neoplasm of urethra: Secondary | ICD-10-CM | POA: Insufficient documentation

## 2022-02-11 DIAGNOSIS — D66 Hereditary factor VIII deficiency: Secondary | ICD-10-CM

## 2022-02-11 DIAGNOSIS — C662 Malignant neoplasm of left ureter: Secondary | ICD-10-CM

## 2022-02-11 DIAGNOSIS — N1330 Unspecified hydronephrosis: Secondary | ICD-10-CM | POA: Insufficient documentation

## 2022-02-11 DIAGNOSIS — I10 Essential (primary) hypertension: Secondary | ICD-10-CM | POA: Insufficient documentation

## 2022-02-11 HISTORY — DX: Depression, unspecified: F32.A

## 2022-02-11 HISTORY — DX: Presence of spectacles and contact lenses: Z97.3

## 2022-02-11 HISTORY — DX: Anxiety disorder, unspecified: F41.9

## 2022-02-11 HISTORY — DX: Essential (primary) hypertension: I10

## 2022-02-11 HISTORY — DX: Type 2 diabetes mellitus without complications: E11.9

## 2022-02-11 HISTORY — PX: CYSTOSCOPY WITH URETEROSCOPY AND STENT PLACEMENT: SHX6377

## 2022-02-11 LAB — POCT I-STAT, CHEM 8
BUN: 15 mg/dL (ref 6–20)
Calcium, Ion: 1.11 mmol/L — ABNORMAL LOW (ref 1.15–1.40)
Chloride: 110 mmol/L (ref 98–111)
Creatinine, Ser: 1 mg/dL (ref 0.61–1.24)
Glucose, Bld: 182 mg/dL — ABNORMAL HIGH (ref 70–99)
HCT: 39 % (ref 39.0–52.0)
Hemoglobin: 13.3 g/dL (ref 13.0–17.0)
Potassium: 4.2 mmol/L (ref 3.5–5.1)
Sodium: 139 mmol/L (ref 135–145)
TCO2: 22 mmol/L (ref 22–32)

## 2022-02-11 LAB — GLUCOSE, CAPILLARY: Glucose-Capillary: 177 mg/dL — ABNORMAL HIGH (ref 70–99)

## 2022-02-11 SURGERY — CYSTOURETEROSCOPY, WITH STENT INSERTION
Anesthesia: General | Site: Ureter | Laterality: Left

## 2022-02-11 MED ORDER — OXYCODONE HCL 5 MG PO TABS
ORAL_TABLET | ORAL | Status: AC
  Filled 2022-02-11: qty 1

## 2022-02-11 MED ORDER — FENTANYL CITRATE (PF) 100 MCG/2ML IJ SOLN
INTRAMUSCULAR | Status: AC
  Filled 2022-02-11: qty 2

## 2022-02-11 MED ORDER — LIDOCAINE 2% (20 MG/ML) 5 ML SYRINGE
INTRAMUSCULAR | Status: DC | PRN
  Administered 2022-02-11: 100 mg via INTRAVENOUS

## 2022-02-11 MED ORDER — PROPOFOL 10 MG/ML IV BOLUS
INTRAVENOUS | Status: AC
  Filled 2022-02-11: qty 20

## 2022-02-11 MED ORDER — LABETALOL HCL 5 MG/ML IV SOLN
INTRAVENOUS | Status: DC | PRN
  Administered 2022-02-11: 2.5 mg via INTRAVENOUS
  Administered 2022-02-11: 5 mg via INTRAVENOUS

## 2022-02-11 MED ORDER — MIDAZOLAM HCL 2 MG/2ML IJ SOLN
INTRAMUSCULAR | Status: AC
  Filled 2022-02-11: qty 2

## 2022-02-11 MED ORDER — LABETALOL HCL 5 MG/ML IV SOLN
INTRAVENOUS | Status: AC
  Filled 2022-02-11: qty 4

## 2022-02-11 MED ORDER — ONDANSETRON HCL 4 MG/2ML IJ SOLN
INTRAMUSCULAR | Status: DC | PRN
  Administered 2022-02-11: 4 mg via INTRAVENOUS

## 2022-02-11 MED ORDER — KETOROLAC TROMETHAMINE 30 MG/ML IJ SOLN
INTRAMUSCULAR | Status: AC
  Filled 2022-02-11: qty 1

## 2022-02-11 MED ORDER — OXYCODONE HCL 5 MG PO TABS: 5.0000 mg | ORAL_TABLET | ORAL | 0 refills | Status: DC | PRN

## 2022-02-11 MED ORDER — LIDOCAINE HCL (PF) 2 % IJ SOLN
INTRAMUSCULAR | Status: AC
  Filled 2022-02-11: qty 5

## 2022-02-11 MED ORDER — DEXAMETHASONE SODIUM PHOSPHATE 4 MG/ML IJ SOLN
INTRAMUSCULAR | Status: DC | PRN
  Administered 2022-02-11: 5 mg via INTRAVENOUS

## 2022-02-11 MED ORDER — GENTAMICIN SULFATE 40 MG/ML IJ SOLN
5.0000 mg/kg | INTRAVENOUS | Status: AC
  Administered 2022-02-11: 496 mg via INTRAVENOUS
  Filled 2022-02-11: qty 12.5

## 2022-02-11 MED ORDER — MIDAZOLAM HCL 5 MG/5ML IJ SOLN
INTRAMUSCULAR | Status: DC | PRN
  Administered 2022-02-11: 2 mg via INTRAVENOUS

## 2022-02-11 MED ORDER — ONDANSETRON HCL 4 MG/2ML IJ SOLN
INTRAMUSCULAR | Status: AC
  Filled 2022-02-11: qty 2

## 2022-02-11 MED ORDER — OXYCODONE HCL 5 MG/5ML PO SOLN: 5.0000 mg | Freq: Once | ORAL | Status: AC | PRN

## 2022-02-11 MED ORDER — LIDOCAINE HCL URETHRAL/MUCOSAL 2 % EX GEL
CUTANEOUS | Status: DC | PRN
  Administered 2022-02-11: 1 via URETHRAL

## 2022-02-11 MED ORDER — IOHEXOL 300 MG/ML  SOLN
INTRAMUSCULAR | Status: DC | PRN
  Administered 2022-02-11: 4 mL via URETHRAL

## 2022-02-11 MED ORDER — DEXAMETHASONE SODIUM PHOSPHATE 10 MG/ML IJ SOLN
INTRAMUSCULAR | Status: AC
  Filled 2022-02-11: qty 1

## 2022-02-11 MED ORDER — SODIUM CHLORIDE 0.9 % IR SOLN
Status: DC | PRN
  Administered 2022-02-11: 3000 mL

## 2022-02-11 MED ORDER — FENTANYL CITRATE (PF) 100 MCG/2ML IJ SOLN
25.0000 ug | INTRAMUSCULAR | Status: DC | PRN
  Administered 2022-02-11: 50 ug via INTRAVENOUS

## 2022-02-11 MED ORDER — FENTANYL CITRATE (PF) 100 MCG/2ML IJ SOLN
INTRAMUSCULAR | Status: DC | PRN
  Administered 2022-02-11: 50 ug via INTRAVENOUS
  Administered 2022-02-11 (×2): 25 ug via INTRAVENOUS
  Administered 2022-02-11 (×2): 50 ug via INTRAVENOUS

## 2022-02-11 MED ORDER — LACTATED RINGERS IV SOLN: INTRAVENOUS | Status: DC

## 2022-02-11 MED ORDER — PROPOFOL 10 MG/ML IV BOLUS
INTRAVENOUS | Status: DC | PRN
  Administered 2022-02-11: 100 mg via INTRAVENOUS
  Administered 2022-02-11: 50 mg via INTRAVENOUS
  Administered 2022-02-11: 200 mg via INTRAVENOUS
  Administered 2022-02-11: 50 mg via INTRAVENOUS

## 2022-02-11 MED ORDER — KETOROLAC TROMETHAMINE 30 MG/ML IJ SOLN
30.0000 mg | Freq: Once | INTRAMUSCULAR | Status: AC | PRN
  Administered 2022-02-11: 30 mg via INTRAVENOUS

## 2022-02-11 MED ORDER — OXYCODONE HCL 5 MG PO TABS
5.0000 mg | ORAL_TABLET | Freq: Once | ORAL | Status: AC | PRN
  Administered 2022-02-11: 5 mg via ORAL

## 2022-02-11 MED ORDER — ONDANSETRON HCL 4 MG/2ML IJ SOLN: 4.0000 mg | Freq: Once | INTRAMUSCULAR | Status: DC | PRN

## 2022-02-11 MED ORDER — PHENAZOPYRIDINE HCL 200 MG PO TABS: 200.0000 mg | ORAL_TABLET | Freq: Three times a day (TID) | ORAL | 0 refills | Status: DC | PRN

## 2022-02-11 SURGICAL SUPPLY — 20 items
BAG DRAIN URO-CYSTO SKYTR STRL (DRAIN) ×1 IMPLANT
BAG DRN UROCATH (DRAIN) ×1
CATH URETL OPEN 5X70 (CATHETERS) ×1 IMPLANT
CLOTH BEACON ORANGE TIMEOUT ST (SAFETY) ×1 IMPLANT
FORCEPS BIOP 2.4F 115CM BACKLD (INSTRUMENTS) IMPLANT
GLOVE BIO SURGEON STRL SZ7.5 (GLOVE) ×1 IMPLANT
GLOVE SURG SS PI 7.5 STRL IVOR (GLOVE) IMPLANT
GOWN STRL REUS W/TWL LRG LVL3 (GOWN DISPOSABLE) IMPLANT
GOWN STRL REUS W/TWL XL LVL3 (GOWN DISPOSABLE) ×1 IMPLANT
GUIDEWIRE STR DUAL SENSOR (WIRE) ×1 IMPLANT
IV NS IRRIG 3000ML ARTHROMATIC (IV SOLUTION) ×2 IMPLANT
KIT TURNOVER CYSTO (KITS) ×1 IMPLANT
MANIFOLD NEPTUNE II (INSTRUMENTS) ×1 IMPLANT
NS IRRIG 500ML POUR BTL (IV SOLUTION) ×1 IMPLANT
PACK CYSTO (CUSTOM PROCEDURE TRAY) ×1 IMPLANT
SHEATH URETERAL 12FRX35CM (MISCELLANEOUS) IMPLANT
STENT URET 6FRX28 CONTOUR (STENTS) IMPLANT
SYR CONTROL 10ML LL (SYRINGE) IMPLANT
TUBE CONNECTING 12X1/4 (SUCTIONS) IMPLANT
TUBING UROLOGY SET (TUBING) ×1 IMPLANT

## 2022-02-11 NOTE — Anesthesia Procedure Notes (Signed)
Procedure Name: LMA Insertion Date/Time: 02/11/2022 12:14 PM  Performed by: Justice Rocher, CRNAPre-anesthesia Checklist: Patient identified, Emergency Drugs available, Suction available, Patient being monitored and Timeout performed Patient Re-evaluated:Patient Re-evaluated prior to induction Oxygen Delivery Method: Circle system utilized Preoxygenation: Pre-oxygenation with 100% oxygen Induction Type: IV induction Ventilation: Mask ventilation without difficulty LMA: LMA inserted LMA Size: 5.0 Number of attempts: 1 Airway Equipment and Method: Bite block Placement Confirmation: positive ETCO2, breath sounds checked- equal and bilateral and CO2 detector Tube secured with: Tape Dental Injury: Teeth and Oropharynx as per pre-operative assessment

## 2022-02-11 NOTE — Interval H&P Note (Signed)
History and Physical Interval Note: Patient returns for repeat biopsy of his left ureter.  First biopsy was questionable high grade urothelial carcinoma. 02/11/2022 12:06 PM  Caleb Mckinney  has presented today for surgery, with the diagnosis of LEFT URETERAL MASS.  The various methods of treatment have been discussed with the patient and family. After consideration of risks, benefits and other options for treatment, the patient has consented to  Procedure(s) with comments: CYSTOSCOPY WITH LEFT DIAGNOSTIC URETEROSCOPY AND BIOPSY AND LEFT STENT EXCHANGE (Left) - 60 MINUTES NEEDED as a surgical intervention.  The patient's history has been reviewed, patient examined, no change in status, stable for surgery.  I have reviewed the patient's chart and labs.  Questions were answered to the patient's satisfaction.     Ardis Hughs

## 2022-02-11 NOTE — Anesthesia Postprocedure Evaluation (Signed)
Anesthesia Post Note  Patient: Caleb Mckinney  Procedure(s) Performed: CYSTOSCOPY WITH LEFT DIAGNOSTIC URETEROSCOPY AND URETERAL BIOPSY AND LEFT STENT EXCHANGE (Left: Ureter)     Patient location during evaluation: PACU Anesthesia Type: General Level of consciousness: awake and alert Pain management: pain level controlled Vital Signs Assessment: post-procedure vital signs reviewed and stable Respiratory status: spontaneous breathing, nonlabored ventilation, respiratory function stable and patient connected to nasal cannula oxygen Cardiovascular status: blood pressure returned to baseline and stable Postop Assessment: no apparent nausea or vomiting Anesthetic complications: no   No notable events documented.  Last Vitals:  Vitals:   02/11/22 1330 02/11/22 1345  BP: (!) 137/97 (!) 161/108  Pulse: 78 78  Resp: 10 12  Temp:    SpO2: 96% 95%    Last Pain:  Vitals:   02/11/22 1345  TempSrc:   PainSc: 7                  Roarke Marciano S

## 2022-02-11 NOTE — OR Nursing (Signed)
Left ureteral stent was removed by Dr. Louis Meckel and discarded

## 2022-02-11 NOTE — Op Note (Signed)
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  There was no hydronephrosis or other significant abnormality. #2: The patient's proximal ureter did demonstrate some bullous edema, but did appear to be improved from previous look 2 weeks prior.  EBL: Minimal  Specimens: Left renal pelvis and ureteral cytology, left UPJ/ureteral biopsy  Indication: Caleb Mckinney is a 46 y.o. patient with a filling defect in the left ureter as seen on CT scan with associated hydronephrosis.  He was taken to the operating room and evaluated.  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  I went through the results with the patient initially and recommended that we proceed with a more formal biopsy procedure to see if we could ascertain a tissue diagnosis.  After reviewing the management options for treatment, he elected to proceed with the above surgical procedure(s). We have discussed the potential benefits and risks of the procedure, side effects of the proposed treatment, the likelihood of the patient achieving the goals of the procedure, and any potential problems that might occur during the procedure or recuperation. Informed consent has been obtained.  Description of procedure:  The patient was taken to the operating room and general anesthesia was induced.  The patient was placed in the dorsal lithotomy position, prepped and draped in the usual sterile fashion, and preoperative antibiotics were administered. A preoperative time-out  was performed.   21 French 0 degree cystoscope was gently passed to the patient's urethra and into the bladder under visual guidance.  The stent grasper was used to grasp the stent that was emanating from the patient's left ureteral orifice and brought to the urethral meatus.  I advanced a wire through the stent and remove the stent over the wire.  Subsequently advanced a semirigid ureteroscope through the patient's urethra and into the left ureter.  Up in the proximal ureter I reevaluated the area of the above findings.  I then collected a urine cytology.  I then passed a second wire through the scope and remove the scope over the wire.  I then advanced a 12/14 [place] x35 cm ureteral access sheath over the second wire and into the midportion of the ureter.  I remove the inner portion of the access sheath and the wire.  I then advanced a semirigid ureteroscope through the access sheath having backloaded the [place] forceps through the scope.  I took several biopsies of the proximal ureter/UPJ.  I then exchanged the semirigid scope for the flexible ureteroscope and took additional biopsies of the renal pelvis.  I subsequently removed the scope and the access sheath noting no additional ureteral trauma or abnormality.  I then repassed the cystoscope back loading it through the safety wire.  Once into the patient's bladder I advanced a 28 cm time 6 French double-J ureteral stent into the left proximal ureter.  I then advanced into the bladder neck prior to removing the wire in total.  The stent was noted to curl nicely within the upper pole of the kidney as well as at the left ureteral orifice.  The bladder was subsequently emptied and the scope removed.  Lidocaine  jelly was then instilled into the patient's urethra.  The patient was subsequently extubated return the PACU in stable condition. Ardis Hughs, M.D.

## 2022-02-11 NOTE — Discharge Instructions (Addendum)
DISCHARGE INSTRUCTIONS FOR KIDNEY STONE/URETERAL STENT   MEDICATIONS:  1.  Resume all your other meds from home - except do not take any extra narcotic pain meds that you may have at home.  2. Pyridium is to help with the burning/stinging when you urinate. 3. Oxycodone '5mg'$  every 6 hours as needed for break through pain, otherwise it is okay to take both Tylenol or Ibuprofen for mild-moderate pain.  ACTIVITY:  1. No strenuous activity x 1week  2. No driving while on narcotic pain medications  3. Drink plenty of water  4. Continue to walk at home - you can still get blood clots when you are at home, so keep active, but don't over do it.  5. May return to work/school tomorrow or when you feel ready   BATHING:  1. You can shower and we recommend daily showers    SIGNS/SYMPTOMS TO CALL:  Please call us if you have a fever greater than 101.5, uncontrolled nausea/vomiting, uncontrolled pain, dizziness, unable to urinate, bloody urine, chest pain, shortness of breath, leg swelling, leg pain, redness around wound, drainage from wound, or any other concerns or questions.   You can reach [place] at [phone].   FOLLOW-UP:  1.  You will be contacted with the pathology report and additional instructions.  Tentatively is scheduled for surgery to remove the kidney on 02/24/2022   CYSTOSCOPY HOME CARE INSTRUCTIONS  Activity: Rest for the remainder of the day.  Do not drive or operate equipment today.  You may resume normal activities in one to two days as instructed by your physician.   Meals: Drink plenty of liquids and eat light foods such as gelatin or soup this evening.  You may return to a normal meal plan tomorrow.  Return to Work: You may return to work in one to two days or as instructed by your physician.  Special Instructions / Symptoms: Call your physician if any of these symptoms occur:   -persistent or heavy bleeding  -bleeding which continues after first few urination  -large  blood clots that are difficult to pass  -urine stream diminishes or stops completely  -fever equal to or higher than 101 degrees Farenheit.  -cloudy urine with a strong, foul odor  -severe pain  Post Anesthesia Home Care Instructions  Activity: Get plenty of rest for the remainder of the day. A responsible individual must stay with you for 24 hours following the procedure.  For the next 24 hours, DO NOT: -Drive a car -Paediatric nurse -Drink alcoholic beverages -Take any medication unless instructed by your physician -Make any legal decisions or sign important papers.  Meals: Start with liquid foods such as gelatin or soup. Progress to regular foods as tolerated. Avoid greasy, spicy, heavy foods. If nausea and/or vomiting occur, drink only clear liquids until the nausea and/or vomiting subsides. Call your physician if vomiting continues.  Special Instructions/Symptoms: Your throat may feel dry or sore from the anesthesia or the breathing tube placed in your throat during surgery. If this causes discomfort, gargle with warm salt water. The discomfort should disappear within 24 hours.

## 2022-02-11 NOTE — Transfer of Care (Signed)
Immediate Anesthesia Transfer of Care Note  Patient: Caleb Mckinney  Procedure(s) Performed: Procedure(s) (LRB): CYSTOSCOPY WITH LEFT DIAGNOSTIC URETEROSCOPY AND URETERAL BIOPSY AND LEFT STENT EXCHANGE (Left)  Patient Location: PACU  Anesthesia Type: General  Level of Consciousness: awake, sedated, patient cooperative and responds to stimulation  Airway & Oxygen Therapy: Patient Spontanous Breathing and Patient connected to [employer]   Post-op Assessment: Report given to PACU RN, Post -op Vital signs reviewed and stable and Patient moving all extremities  Post vital signs: Reviewed and stable  Complications: No apparent anesthesia complications

## 2022-02-11 NOTE — Anesthesia Preprocedure Evaluation (Signed)
Anesthesia Evaluation  Patient identified by MRN, date of birth, ID band Patient awake    Reviewed: Allergy & Precautions, NPO status , Patient's Chart, lab work & pertinent test results  Airway Mallampati: II  TM Distance: >3 FB Neck ROM: Full    Dental no notable dental hx.    Pulmonary neg pulmonary ROS,    Pulmonary exam normal breath sounds clear to auscultation       Cardiovascular hypertension, Normal cardiovascular exam Rhythm:Regular Rate:Normal  Untreated HTN   Neuro/Psych negative neurological ROS  negative psych ROS   GI/Hepatic negative GI ROS, Neg liver ROS,   Endo/Other    Renal/GU negative Renal ROS  negative genitourinary   Musculoskeletal negative musculoskeletal ROS (+)   Abdominal   Peds negative pediatric ROS (+)  Hematology  (+) Blood dyscrasia, FACTOR VIII (HEMOPHILIA) ,   Anesthesia Other Findings   Reproductive/Obstetrics negative OB ROS                             Anesthesia Physical Anesthesia Plan  ASA: 3  Anesthesia Plan: General   Post-op Pain Management: Minimal or no pain anticipated   Induction: Intravenous  PONV Risk Score and Plan: 2 and Ondansetron, Dexamethasone and Treatment may vary due to age or medical condition  Airway Management Planned: LMA  Additional Equipment:   Intra-op Plan:   Post-operative Plan: Extubation in OR  Informed Consent: I have reviewed the patients History and Physical, chart, labs and discussed the procedure including the risks, benefits and alternatives for the proposed anesthesia with the patient or authorized representative who has indicated his/her understanding and acceptance.     Dental advisory given  Plan Discussed with: CRNA and Surgeon  Anesthesia Plan Comments:         Anesthesia Quick Evaluation

## 2022-02-14 ENCOUNTER — Encounter (HOSPITAL_BASED_OUTPATIENT_CLINIC_OR_DEPARTMENT_OTHER): Payer: Self-pay | Admitting: Urology

## 2022-02-16 ENCOUNTER — Other Ambulatory Visit (HOSPITAL_COMMUNITY): Payer: Self-pay | Admitting: *Deleted

## 2022-02-17 LAB — SURGICAL PATHOLOGY

## 2022-02-17 LAB — CYTOLOGY - NON PAP

## 2022-02-17 NOTE — Progress Notes (Signed)
COVID Vaccine received:  '[]'$  No '[x]'$  Yes Date of any COVID positive Test in last 90 days:  PCP - none ?   [facility] Cardiologist -   Chest x-ray - 12-23-2021  Epic EKG -  12-24-2021  Epic Stress Test - n/a ECHO - n/an/a  Pacemaker/ICD device     '[x]'$  N/A Spinal Cord Stimulator:'[x]'$  No '[]'$  Yes      (Remind patient to bring remote DOS) Other Implants:   Bowel Prep -   History of Sleep Apnea? '[]'$  No '[]'$  Yes   Sleep Study Date:   CPAP used?- '[]'$  No '[]'$  Yes  (Instruct to bring their mask & Tubing)  Does the patient monitor blood sugar? '[]'$  No '[]'$  Yes  '[]'$  N/A Does patient have a [employer] or Dexacom? 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  Patient verbalized understanding and agreement to the Pre-Surgical Instructions that were given to them at this PAT appointment. Patient was also educated of the need to review these PAT instructions again prior to his/her surgery.I reviewed the appropriate phone numbers to call if they have any and questions or concerns.

## 2022-02-17 NOTE — Patient Instructions (Signed)
SURGICAL WAITING ROOM VISITATION Patients having surgery or a procedure may have no more than 2 support people in the waiting area - these visitors may rotate in the visitor waiting room.   Children under the age of 22 must have an adult with them who is not the patient. If the patient needs to stay at the hospital during part of their recovery, the visitor guidelines for inpatient rooms apply.  PRE-OP VISITATION  Pre-op nurse will coordinate an appropriate time for 1 support person to accompany the patient in pre-op.  This support person may not rotate.  This visitor will be contacted when the time is appropriate for the visitor to come back in the pre-op area.  Please refer to the [facility] website for the visitor guidelines for Inpatients (after your surgery is over and you are in a regular room).  You are not required to quarantine at this time prior to your surgery. However, you must do this: Hand Hygiene often Do NOT share personal items Notify your provider if you are in close contact with someone who has COVID or you develop fever 100.4 or greater, new onset of sneezing, cough, sore throat, shortness of breath or body aches.   If you received a COVID test during your pre-op visit  it is requested that you wear a mask when out in public, stay away from anyone that may not be feeling well and notify your surgeon if you develop symptoms. If you test positive for Covid or have been in contact with anyone that has tested positive in the last 10 days please notify you surgeon.       Your procedure is scheduled on:  Friday  February 25, 2022  Report to [facility] Main Entrance.  Report to admitting at:07:45   AM  +++++Call this number if you have any questions or problems the morning of surgery [phone]   +++++ Clear liquid diet the day before your surgery, After Midnight you may have the following liquids until   07:00 AM  DAY OF SURGERY  Clear Liquid  Diet Water Black Coffee (sugar ok, NO MILK/CREAM OR CREAMERS)  Tea (sugar ok, NO MILK/CREAM OR CREAMERS) regular and decaf                             Plain Jell-O (NO RED)                                           Fruit ices (not with fruit pulp, NO RED)                                     Popsicles (NO RED)                                                                  Juice: apple, WHITE grape, WHITE cranberry Sports drinks like Gatorade (NO RED)                 FOLLOW BOWEL PREP AND ANY ADDITIONAL PRE  OP INSTRUCTIONS YOU RECEIVED FROM YOUR SURGEON'S OFFICE!!!   Oral Hygiene is also important to reduce your risk of infection.        Remember - BRUSH YOUR TEETH THE MORNING OF SURGERY WITH YOUR REGULAR TOOTHPASTE  Do NOT smoke after Midnight the night before surgery.  Take ONLY these medicines the morning of surgery with A SIP OF WATER: Tamsulosin, and Oxycodone if needed for pain                 You may not have any metal on your body including  jewelry, and body piercing  Do not wear  lotions, powders, cologne, or deodorant  Men may shave face and neck.  Contacts, Hearing Aids, dentures or bridgework may not be worn into surgery.   You may bring a small overnight bag with you on the day of surgery, only pack items that are not valuable .[facility] IS NOT RESPONSIBLE   FOR VALUABLES THAT ARE LOST OR STOLEN.   DO NOT [place]. PHARMACY WILL DISPENSE MEDICATIONS LISTED ON YOUR MEDICATION LIST TO YOU DURING YOUR ADMISSION [place]!    Please read over the following fact sheets you were given: IF YOU HAVE QUESTIONS ABOUT YOUR PRE-OP INSTRUCTIONS, PLEASE CALL [phone]  ([place])   [facility] - Preparing for Surgery Before surgery, you can play an important role.  Because skin is not sterile, your skin needs to be as free of germs as possible.  You can reduce the number of germs on your skin by washing with CHG (chlorahexidine  gluconate) soap before surgery.  CHG is an antiseptic cleaner which kills germs and bonds with the skin to continue killing germs even after washing. Please DO NOT use if you have an allergy to CHG or antibacterial soaps.  If your skin becomes reddened/irritated stop using the CHG and inform your nurse when you arrive at Short Stay. Do not shave (including legs and underarms) for at least 48 hours prior to the first CHG shower.  You may shave your face/neck.  Please follow these instructions carefully:  1.  Shower with CHG Soap the night before surgery and the  morning of surgery.  2.  If you choose to wash your hair, wash your hair first as usual with your normal  shampoo.  3.  After you shampoo, rinse your hair and body thoroughly to remove the shampoo.                             4.  Use CHG as you would any other liquid soap.  You can apply chg directly to the skin and wash.  Gently with a scrungie or clean washcloth.  5.  Apply the CHG Soap to your body ONLY FROM THE NECK DOWN.   Do not use on face/ open                           Wound or open sores. Avoid contact with eyes, ears mouth and genitals (private parts).                       Wash face,  Genitals (private parts) with your normal soap.             6.  Wash thoroughly, paying special attention to the area where your  surgery  will be performed.  7.  Thoroughly rinse your body with warm water from the neck down.  8.  DO NOT shower/wash with your normal soap after using and rinsing off the CHG Soap.            9.  Pat yourself dry with a clean towel.            10.  Wear clean pajamas.            11.  Place clean sheets on your bed the night of your first shower and do not  sleep with pets.  ON THE DAY OF SURGERY : Do not apply any lotions/deodorants the morning of surgery.  Please wear clean clothes to the hospital/surgery center.    FAILURE TO FOLLOW THESE INSTRUCTIONS MAY RESULT IN THE CANCELLATION OF YOUR SURGERY  PATIENT  SIGNATURE_________________________________  NURSE SIGNATURE__________________________________  ________________________________________________________________________

## 2022-02-21 ENCOUNTER — Encounter (HOSPITAL_COMMUNITY): Payer: BC Managed Care – PPO

## 2022-02-25 ENCOUNTER — Inpatient Hospital Stay (HOSPITAL_COMMUNITY): Admission: RE | Admit: 2022-02-25 | Payer: BC Managed Care – PPO | Source: Home / Self Care | Admitting: Urology

## 2022-02-25 ENCOUNTER — Encounter (HOSPITAL_COMMUNITY): Admission: RE | Payer: Self-pay | Source: Home / Self Care

## 2022-02-25 SURGERY — NEPHROURETERECTOMY, ROBOT-ASSISTED, LAPAROSCOPIC
Anesthesia: General | Laterality: Left

## 2022-11-10 ENCOUNTER — Emergency Department (HOSPITAL_COMMUNITY)
Admission: EM | Admit: 2022-11-10 | Discharge: 2022-11-10 | Disposition: A | Discharge status: Home / Self Care | Payer: Self-pay | Attending: Emergency Medicine | Admitting: Emergency Medicine

## 2022-11-10 ENCOUNTER — Emergency Department (HOSPITAL_COMMUNITY): Payer: Self-pay

## 2022-11-10 ENCOUNTER — Other Ambulatory Visit: Payer: Self-pay

## 2022-11-10 ENCOUNTER — Encounter (HOSPITAL_COMMUNITY): Payer: Self-pay

## 2022-11-10 DIAGNOSIS — Z79899 Other long term (current) drug therapy: Secondary | ICD-10-CM | POA: Insufficient documentation

## 2022-11-10 DIAGNOSIS — E119 Type 2 diabetes mellitus without complications: Secondary | ICD-10-CM | POA: Insufficient documentation

## 2022-11-10 DIAGNOSIS — R109 Unspecified abdominal pain: Secondary | ICD-10-CM | POA: Insufficient documentation

## 2022-11-10 DIAGNOSIS — Z7984 Long term (current) use of oral hypoglycemic drugs: Secondary | ICD-10-CM | POA: Insufficient documentation

## 2022-11-10 DIAGNOSIS — I10 Essential (primary) hypertension: Secondary | ICD-10-CM | POA: Insufficient documentation

## 2022-11-10 LAB — COMPREHENSIVE METABOLIC PANEL
ALT: 41 U/L (ref 0–44)
AST: 28 U/L (ref 15–41)
Albumin: 3.9 g/dL (ref 3.5–5.0)
Alkaline Phosphatase: 62 U/L (ref 38–126)
Anion gap: 9 (ref 5–15)
BUN: 19 mg/dL (ref 6–20)
CO2: 23 mmol/L (ref 22–32)
Calcium: 8.9 mg/dL (ref 8.9–10.3)
Chloride: 102 mmol/L (ref 98–111)
Creatinine, Ser: 0.86 mg/dL (ref 0.61–1.24)
GFR, Estimated: >60 mL/min (ref >60)
Glucose, Bld: 285 mg/dL — ABNORMAL HIGH (ref 70–99)
Potassium: 4 mmol/L (ref 3.5–5.1)
Sodium: 134 mmol/L — ABNORMAL LOW (ref 135–145)
Total Bilirubin: 1.2 mg/dL (ref 0.3–1.2)
Total Protein: 7.3 g/dL (ref 6.5–8.1)

## 2022-11-10 LAB — CBC WITH DIFFERENTIAL/PLATELET
Abs Immature Granulocytes: 0.02 10*3/uL (ref 0.00–0.07)
Basophils Absolute: 0 10*3/uL (ref 0.0–0.1)
Basophils Relative: 1 %
Eosinophils Absolute: 0.1 10*3/uL (ref 0.0–0.5)
Eosinophils Relative: 2 %
HCT: 44.9 % (ref 39.0–52.0)
Hemoglobin: 15.1 g/dL (ref 13.0–17.0)
Immature Granulocytes: 0 %
Lymphocytes Relative: 23 %
Lymphs Abs: 1.5 10*3/uL (ref 0.7–4.0)
MCH: 32.9 pg (ref 26.0–34.0)
MCHC: 33.6 g/dL (ref 30.0–36.0)
MCV: 97.8 fL (ref 80.0–100.0)
Monocytes Absolute: 0.6 10*3/uL (ref 0.1–1.0)
Monocytes Relative: 9 %
Neutro Abs: 4.4 10*3/uL (ref 1.7–7.7)
Neutrophils Relative %: 65 %
Platelets: 276 10*3/uL (ref 150–400)
RBC: 4.59 MIL/uL (ref 4.22–5.81)
RDW: 11.1 % — ABNORMAL LOW (ref 11.5–15.5)
WBC: 6.6 10*3/uL (ref 4.0–10.5)
nRBC: 0 % (ref 0.0–0.2)

## 2022-11-10 LAB — URINALYSIS, ROUTINE W REFLEX MICROSCOPIC
Bacteria, UA: NONE SEEN
Bilirubin Urine: NEGATIVE
Glucose, UA: >=500 mg/dL — AB
Hgb urine dipstick: NEGATIVE
Ketones, ur: NEGATIVE mg/dL
Leukocytes,Ua: NEGATIVE
Nitrite: NEGATIVE
Protein, ur: NEGATIVE mg/dL
Specific Gravity, Urine: 1.03 (ref 1.005–1.030)
pH: 6 (ref 5.0–8.0)

## 2022-11-10 LAB — TROPONIN I (HIGH SENSITIVITY): Troponin I (High Sensitivity): 4 ng/L (ref <18)

## 2022-11-10 MED ORDER — METHOCARBAMOL 500 MG PO TABS: 1000.0000 mg | ORAL_TABLET | Freq: Every day | ORAL | 0 refills | Status: AC

## 2022-11-10 MED ORDER — KETOROLAC TROMETHAMINE 30 MG/ML IJ SOLN
30.0000 mg | Freq: Once | INTRAMUSCULAR | Status: AC
  Administered 2022-11-10: 30 mg via INTRAVENOUS
  Filled 2022-11-10: qty 1

## 2022-11-10 MED ORDER — NAPROXEN 500 MG PO TABS: 500.0000 mg | ORAL_TABLET | Freq: Two times a day (BID) | ORAL | 0 refills | Status: AC | PRN

## 2022-11-10 MED ORDER — LISINOPRIL 10 MG PO TABS: 10.0000 mg | ORAL_TABLET | Freq: Every day | ORAL | 1 refills | Status: AC

## 2022-11-10 MED ORDER — HYDROMORPHONE HCL 1 MG/ML IJ SOLN
1.0000 mg | INTRAMUSCULAR | Status: DC | PRN
  Administered 2022-11-10: 1 mg via INTRAVENOUS
  Filled 2022-11-10: qty 1

## 2022-11-10 MED ORDER — METFORMIN HCL 500 MG PO TABS: 500.0000 mg | ORAL_TABLET | Freq: Two times a day (BID) | ORAL | 1 refills | Status: AC

## 2022-11-10 NOTE — ED Provider Notes (Signed)
[place] EMERGENCY DEPARTMENT AT [facility] Provider Note   CSN: [id number] Arrival date & time: 11/10/22  1033     History  Chief Complaint  Patient presents with   Flank Pain    Caleb Mckinney is a 47 y.o. male with history of hypertension, nephrolithiasis presents with 7 days of left flank pain. 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 This morning he also noted swelling over the left flank.  He states that this pain is not consistent with previous episodes of kidney stones.  The pain also travels up into the shoulders and arms and he describes this as a tightness.  Denies any fever, chills, abdominal pain, nausea, vomiting, jaw pain, shortness of breath, chest pain.  He denies any hematuria, dysuria, changes in bowel or bladder habits, denies any concern for STIs.  Denies any trauma to the area.  He has tried taking ibuprofen at home without relief.  He states that his normal blood pressure is elevated around a systolic of 140.   Of note he has history of cystoscopy with ureteral stent placement from August 2023.  The stent was removed in September 2023. There was concern for carcinoma of the kidney and he is being followed by urology.    Flank Pain Pertinent negatives include no chest pain, no abdominal pain and no shortness of breath.       Home Medications Prior to Admission medications   Medication Sig Start Date End Date Taking? Authorizing Provider  lisinopril (ZESTRIL) 10 MG tablet Take 1 tablet (10 mg total) by mouth daily. 11/10/22 12/10/22 Yes Arabella Merles, PA-C  metFORMIN (GLUCOPHAGE) 500 MG tablet Take 1 tablet (500 mg total) by mouth 2 (two) times daily with a meal. 11/10/22 12/10/22 Yes Arabella Merles, PA-C  methocarbamol (ROBAXIN) 500 MG tablet Take 2 tablets (1,000 mg total) by mouth at bedtime for 7 days. 11/10/22 11/17/22 Yes Arabella Merles, PA-C  naproxen (NAPROSYN) 500 MG tablet Take 1 tablet (500 mg total) by  mouth every 12 (twelve) hours as needed for up to 7 days for moderate pain. 11/10/22 11/17/22 Yes Arabella Merles, PA-C      Allergies    Patient has no known allergies.    Review of Systems   Review of Systems  Constitutional:  Negative for chills and fever.  Respiratory:  Negative for shortness of breath.   Cardiovascular:  Negative for chest pain.  Gastrointestinal:  Negative for abdominal pain, diarrhea, nausea and vomiting.  Genitourinary:  Positive for flank pain.    Physical Exam Updated Vital Signs BP (!) 161/116   Pulse 74   Temp 98.2 F (36.8 C) (Oral)   Resp 14   Ht 6\' 4"  (1.93 m)   Wt 117.9 kg   SpO2 95%   BMI 31.65 kg/m  Physical Exam Vitals and nursing note reviewed.  Cardiovascular:     Rate and Rhythm: Normal rate and regular rhythm.     Heart sounds: Normal heart sounds. No murmur heard. Pulmonary:     Effort: Pulmonary effort is normal.     Breath sounds: Normal breath sounds.  Abdominal:     General: Abdomen is flat. Bowel sounds are normal. There is no distension.     Palpations: Abdomen is soft.     Tenderness: There is no abdominal tenderness. There is right CVA tenderness and left CVA tenderness. There is no guarding or rebound.  Skin:    Comments: Edema overlying the  left flank area.  No erythema, rashes, obvious wounds over the left flank skin  Neurological:     Mental Status: He is alert.     ED Results / Procedures / Treatments   Labs (all labs ordered are listed, but only abnormal results are displayed) Labs Reviewed  URINALYSIS, ROUTINE W REFLEX MICROSCOPIC - Abnormal; Notable for the following components:      Result Value   Glucose, UA >=500 (*)    All other components within normal limits  CBC WITH DIFFERENTIAL/PLATELET - Abnormal; Notable for the following components:   RDW 11.1 (*)    All other components within normal limits  COMPREHENSIVE METABOLIC PANEL - Abnormal; Notable for the following components:   Sodium 134 (*)     Glucose, Bld 285 (*)    All other components within normal limits  TROPONIN I (HIGH SENSITIVITY)    EKG None  Radiology CT Renal Stone Study  Result Date: 11/10/2022 CLINICAL DATA:  Left flank pain several days. EXAM: CT ABDOMEN AND PELVIS WITHOUT CONTRAST TECHNIQUE: Multidetector CT imaging of the abdomen and pelvis was performed following the standard protocol without IV contrast. RADIATION DOSE REDUCTION: This exam was performed according to the departmental dose-optimization program which includes automated exposure control, adjustment of the mA and/or kV according to patient size and/or use of iterative reconstruction technique. COMPARISON:  01/17/2022 FINDINGS: Lower chest: No acute findings. Hepatobiliary: No mass visualized on this unenhanced exam. Gallbladder is unremarkable. No evidence of biliary ductal dilatation. Pancreas: No mass or inflammatory process visualized on this unenhanced exam. Spleen:  Within normal limits in size. Adrenals/Urinary tract: No evidence of urolithiasis or hydronephrosis. Unremarkable unopacified urinary bladder. Stomach/Bowel: No evidence of obstruction, inflammatory process, or abnormal fluid collections. Normal appendix visualized. Vascular/Lymphatic: No pathologically enlarged lymph nodes identified. No evidence of abdominal aortic aneurysm. Reproductive:  No mass or other significant abnormality. Other:  None. Musculoskeletal:  No suspicious bone lesions identified. IMPRESSION: No evidence of urolithiasis, hydronephrosis, or other acute findings. Electronically Signed   By: Danae Orleans M.D.   On: 11/10/2022 13:32    Procedures Procedures    Medications Ordered in ED Medications  HYDROmorphone (DILAUDID) injection 1 mg (1 mg Intravenous Given 11/10/22 1210)  ketorolac (TORADOL) 30 MG/ML injection 30 mg (30 mg Intravenous Given 11/10/22 1116)    ED Course/ Medical Decision Making/ A&P                             Medical Decision Making Amount  and/or Complexity of Data Reviewed Labs: ordered. Radiology: ordered.  Risk Prescription drug management.   47 y.o. male presents to the ED for concern of left flank pain for 7 days, now with swelling over the left flank for 1 day.   Differential diagnosis includes but is not limited to nephrolithiasis, pyelonephritis, cellulitis, aortic dissection, ACS. Concern for nephrolithiasis due to left flank pain and lack of systemic symptoms.  Less concerned for pyelonephritis given no fever, chills.  On physical exam there is soft tissue swelling over the left flank but no erythema or rash, less concern for cellulitis.  He does state some change arm and shoulder "tightness", considering ACS will get a troponin and EKG.  Aortic dissection less likely given patient blood pressure similar to baseline, no chest pain, and patient resting fairly comfortable in bed.  ED Course:  11:25am: Patient states his pain is a 7 out of 10 currently.  Will give Toradol for  pain control.  Awaiting results from CT renal stone study, CBC, CMP, troponin.  Urinalysis with no RBCs, however glucose above 500. Will obtain glucose value from CMP.  12:02pm: Nurse reports patient's pain is not controlled with Toradol.  Patient reports 8 out of 10 pain currently. Will give Dilaudid  12:24pm: CBC with no leukocytosis, no systemic fever or chills, less concern for infectious process at this time. Troponin of 4, less concern for ACS.  Still awaiting CT renal stone study. 1:58pm: CT no evidence of nephrolithiasis or other pathology.  Patient continues to endorse severe sharp pain.  Upon re-evaluation, patient still endorses severe sharp pain on the left flank.  We discussed that his CT shows no concerning findings.  No concern for ACS at this time.  Could consider musculoskeletal pain although he seems to be in more pain than would be expected.    We discussed that he could be admitted for pain control or we can discharge him home for  follow-up with urology.  He prefers to be discharged.  Discussed that his glucose is elevated today at 285.  He has had multiple glucose readings above 200 in the last year.  Discussed that this is diagnostic of diabetes.  Will start him on metformin today and discussed that he needs to follow-up with his primary care provider for further management of his diabetes.  He understands that diabetes can cause many health problems and it is necessary for him to work with her primary care provider to get this under control.  He reports that he has always had high blood pressure but does not take any medication to control it.  Today blood pressure was initially recorded at 176/113 and came down to 149/111 on discharge. Discussed with Fayrene Helper, PA-C and feel it is appropriate to start him on medication given uncontrolled blood pressures.  Will start patient on lisinopril due to reno protective effects. He understands he will need to follow-up with his primary care provider for further management.  The patient was discharged home with instructions to follow-up with urology regarding his flank pain and to follow-up with his PCP regarding his hypertension and diabetes.  He understands he needs to make these appointments.  He may take methocarbamol at night as needed and naproxen 250mg  BID as needed for his flank pain. He does not have any known history of stomach ulcer and Cr within normal limits today, NSAID medication is appropriate for pain control.   Return precautions given.  Impression: Left flank plain, possibly musculoskeletal in nature Diabetes Hypertension   Lab Tests: I Ordered, and personally interpreted labs.  The pertinent results include:   Troponin 4 CBC shows no leukocytosis CMP within elevated glucose at 285, this seems to be consistent with patient baseline.  He had a glucose of 241 and 301 in August 2023 Urinalysis with no RBCs, elevated glucose  Imaging Studies ordered: I ordered  imaging studies including CT renal stone study I independently visualized and interpreted imaging which showed no findings of nephrolithiasis, hydronephrosis, other concerning findings. I agree with the radiologist interpretation   Co morbidities that complicate the patient evaluation  Concern for kidney carcinoma, history of elevated blood pressures, history of elevated blood glucose  Social Determinants of Health:  Does not follow with PCP            Final Clinical Impression(s) / ED Diagnoses Final diagnoses:  Left flank pain  Hypertension, unspecified type  Type 2 diabetes mellitus without complication, without long-term  current use of insulin (HCC)    Rx / DC Orders ED Discharge Orders          Ordered    methocarbamol (ROBAXIN) 500 MG tablet  Daily at bedtime        11/10/22 1458    metFORMIN (GLUCOPHAGE) 500 MG tablet  2 times daily with meals        11/10/22 1458    lisinopril (ZESTRIL) 10 MG tablet  Daily        11/10/22 1458    naproxen (NAPROSYN) 500 MG tablet  Every 12 hours PRN        11/10/22 1458              Arabella Merles, PA-C 11/10/22 1515    Linwood Dibbles, MD 11/11/22 [phone]

## 2022-11-10 NOTE — ED Triage Notes (Signed)
Pt presents with c/o left flank pain that has been going for several days. Pt reports he has been following a urologist and had a questionable kidney cancer diagnosis. Pt also feels like his BP is elevated. Pt does have a hx of kidney stones.

## 2022-11-10 NOTE — Discharge Instructions (Addendum)
You have been prescribed a muscle relaxant, methocarbamol (Robaxin), for your back pain.  You may take this before bed as needed for back pain. Do not operate heavy machinery after taking this medication. This medication may make you drowsy.  You may also apply hot compresses to the area to help with the pain. You may also try taking naproxen (Aleve) 500 mg every 12 hours as needed for pain. This is a medication you can get over the counter.  Please make an appointment with your urologist Dr. Marlou Porch, as soon as possible to discuss management of your flank pain.  You must call to make this appointment.  Please call your primary care provider to set up an appointment within the next 2 months to discuss your diabetes and high blood pressure.  Have been prescribed metformin to help with your high blood sugars.  Please take this medication as prescribed.  You have been prescribed lisinopril to help with your high blood pressure.  Please take this medication as prescribed.  Aim to incorporate 150 minutes of exercise per week and to follow a DASH diet.  This will also help to control your high blood sugar and high blood pressure in addition to the medications.  It is important to control your blood sugars and blood pressure as these can lead to further problems such as vision loss, stroke, heart attacks.   Return to the ER should your pain become uncontrolled, you have blood in your urine, you develop fever, chills, shortness of breath, chest pain.

## 2022-11-10 NOTE — ED Provider Notes (Signed)
I was involved in patient care along with Marchelle Folks, PA-C.  This is a 47 year old male here with atraumatic left flank pain ongoing for the past several days.  Pain is progressive and getting worse despite using over-the-counter medication.  No urinary symptoms no fever or chills no trauma no hematuria.  Workup today overall reassuring.  Patient does have evidence of diabetes as his CBG is 285 without any anion gap to suggest DKA.  He has not been formally diagnosed with diabetes but I suspect patient does have underlying history of diabetes.  I felt it would be appropriate to treat with metformin and patient should follow-up closely with PCP for further assessment.  A CT scan of his abdomen pelvis was obtained without any acute finding.  However EMR reviewed and patient was seen previously for flank pain and has had abnormal results on his CT scan followed by further evaluation by urology including biopsy.  Patient report he was told that he was too young to have signs of cancer according to the urologist.  However, with strong encouraged patient to follow-up closely with urology for outpatient evaluation of his flank pain.  At this time have no indication that patient is having acute urinary retention or UTI or kidney stone.  He may benefit from a short course of pain medication for symptom control and will give strict return precaution.  Overall patient is well-appearing and no evidence of shingles noted on exam.  Low suspicion for dissection.  No other acute emergent medical condition identified.  Please note his blood pressure is elevated 176/113.  Patient does have known history of hypertension but is not on any blood pressure medication.  Will also prescribe a blood pressure medication for poorly controlled hypertension.  BP (!) 176/113 (BP Location: Left Arm)   Pulse 93   Temp 98.2 F (36.8 C) (Oral)   Resp 18   Ht 6\' 4"  (1.93 m)   Wt 117.9 kg   SpO2 94%   BMI 31.65 kg/m   Results for orders placed  or performed during the hospital encounter of 11/10/22  Urinalysis, Routine w reflex microscopic -Urine, Clean Catch  Result Value Ref Range   Color, Urine YELLOW YELLOW   APPearance CLEAR CLEAR   Specific Gravity, Urine 1.030 1.005 - 1.030   pH 6.0 5.0 - 8.0   Glucose, UA >=500 (A) NEGATIVE mg/dL   Hgb urine dipstick NEGATIVE NEGATIVE   Bilirubin Urine NEGATIVE NEGATIVE   Ketones, ur NEGATIVE NEGATIVE mg/dL   Protein, ur NEGATIVE NEGATIVE mg/dL   Nitrite NEGATIVE NEGATIVE   Leukocytes,Ua NEGATIVE NEGATIVE   RBC / HPF 0-5 0 - 5 RBC/hpf   WBC, UA 0-5 0 - 5 WBC/hpf   Bacteria, UA NONE SEEN NONE SEEN   Squamous Epithelial / HPF 0-5 0 - 5 /HPF   Mucus PRESENT   CBC with Differential  Result Value Ref Range   WBC 6.6 4.0 - 10.5 K/uL   RBC 4.59 4.22 - 5.81 MIL/uL   Hemoglobin 15.1 13.0 - 17.0 g/dL   HCT 14.7 82.9 - 56.2 %   MCV 97.8 80.0 - 100.0 fL   MCH 32.9 26.0 - 34.0 pg   MCHC 33.6 30.0 - 36.0 g/dL   RDW 13.0 (L) 86.5 - 78.4 %   Platelets 276 150 - 400 K/uL   nRBC 0.0 0.0 - 0.2 %   Neutrophils Relative % 65 %   Neutro Abs 4.4 1.7 - 7.7 K/uL   Lymphocytes Relative 23 %  Lymphs Abs 1.5 0.7 - 4.0 K/uL   Monocytes Relative 9 %   Monocytes Absolute 0.6 0.1 - 1.0 K/uL   Eosinophils Relative 2 %   Eosinophils Absolute 0.1 0.0 - 0.5 K/uL   Basophils Relative 1 %   Basophils Absolute 0.0 0.0 - 0.1 K/uL   Immature Granulocytes 0 %   Abs Immature Granulocytes 0.02 0.00 - 0.07 K/uL  Comprehensive metabolic panel  Result Value Ref Range   Sodium 134 (L) 135 - 145 mmol/L   Potassium 4.0 3.5 - 5.1 mmol/L   Chloride 102 98 - 111 mmol/L   CO2 23 22 - 32 mmol/L   Glucose, Bld 285 (H) 70 - 99 mg/dL   BUN 19 6 - 20 mg/dL   Creatinine, Ser 1.61 0.61 - 1.24 mg/dL   Calcium 8.9 8.9 - 09.6 mg/dL   Total Protein 7.3 6.5 - 8.1 g/dL   Albumin 3.9 3.5 - 5.0 g/dL   AST 28 15 - 41 U/L   ALT 41 0 - 44 U/L   Alkaline Phosphatase 62 38 - 126 U/L   Total Bilirubin 1.2 0.3 - 1.2 mg/dL   GFR,  Estimated >04 >54 mL/min   Anion gap 9 5 - 15  Troponin I (High Sensitivity)  Result Value Ref Range   Troponin I (High Sensitivity) 4 <18 ng/L   CT Renal Stone Study  Result Date: 11/10/2022 CLINICAL DATA:  Left flank pain several days. EXAM: CT ABDOMEN AND PELVIS WITHOUT CONTRAST TECHNIQUE: Multidetector CT imaging of the abdomen and pelvis was performed following the standard protocol without IV contrast. RADIATION DOSE REDUCTION: This exam was performed according to the departmental dose-optimization program which includes automated exposure control, adjustment of the mA and/or kV according to patient size and/or use of iterative reconstruction technique. COMPARISON:  01/17/2022 FINDINGS: Lower chest: No acute findings. Hepatobiliary: No mass visualized on this unenhanced exam. Gallbladder is unremarkable. No evidence of biliary ductal dilatation. Pancreas: No mass or inflammatory process visualized on this unenhanced exam. Spleen:  Within normal limits in size. Adrenals/Urinary tract: No evidence of urolithiasis or hydronephrosis. Unremarkable unopacified urinary bladder. Stomach/Bowel: No evidence of obstruction, inflammatory process, or abnormal fluid collections. Normal appendix visualized. Vascular/Lymphatic: No pathologically enlarged lymph nodes identified. No evidence of abdominal aortic aneurysm. Reproductive:  No mass or other significant abnormality. Other:  None. Musculoskeletal:  No suspicious bone lesions identified. IMPRESSION: No evidence of urolithiasis, hydronephrosis, or other acute findings. Electronically Signed   By: Danae Orleans M.D.   On: 11/10/2022 13:32      Fayrene Helper, PA-C 11/10/22 1416    Linwood Dibbles, MD 11/11/22 [phone]

## 2023-02-17 ENCOUNTER — Emergency Department (HOSPITAL_BASED_OUTPATIENT_CLINIC_OR_DEPARTMENT_OTHER): Payer: Self-pay

## 2023-02-17 ENCOUNTER — Emergency Department (HOSPITAL_BASED_OUTPATIENT_CLINIC_OR_DEPARTMENT_OTHER)
Admission: EM | Admit: 2023-02-17 | Discharge: 2023-02-17 | Disposition: A | Discharge status: Home / Self Care | Payer: Self-pay | Attending: Emergency Medicine | Admitting: Emergency Medicine

## 2023-02-17 ENCOUNTER — Other Ambulatory Visit: Payer: Self-pay

## 2023-02-17 DIAGNOSIS — Z7984 Long term (current) use of oral hypoglycemic drugs: Secondary | ICD-10-CM | POA: Insufficient documentation

## 2023-02-17 DIAGNOSIS — Z79899 Other long term (current) drug therapy: Secondary | ICD-10-CM | POA: Insufficient documentation

## 2023-02-17 DIAGNOSIS — S9781XA Crushing injury of right foot, initial encounter: Secondary | ICD-10-CM | POA: Insufficient documentation

## 2023-02-17 DIAGNOSIS — S9032XA Contusion of left foot, initial encounter: Secondary | ICD-10-CM | POA: Insufficient documentation

## 2023-02-17 DIAGNOSIS — E119 Type 2 diabetes mellitus without complications: Secondary | ICD-10-CM | POA: Insufficient documentation

## 2023-02-17 DIAGNOSIS — S9780XA Crushing injury of unspecified foot, initial encounter: Secondary | ICD-10-CM

## 2023-02-17 DIAGNOSIS — W228XXA Striking against or struck by other objects, initial encounter: Secondary | ICD-10-CM | POA: Insufficient documentation

## 2023-02-17 MED ORDER — OXYCODONE-ACETAMINOPHEN 5-325 MG PO TABS: 1.0000 | ORAL_TABLET | Freq: Three times a day (TID) | ORAL | 0 refills | Status: AC | PRN

## 2023-02-17 MED ORDER — OXYCODONE-ACETAMINOPHEN 5-325 MG PO TABS
1.0000 | ORAL_TABLET | Freq: Once | ORAL | Status: AC
  Administered 2023-02-17: 1 via ORAL
  Filled 2023-02-17: qty 1

## 2023-02-17 NOTE — ED Triage Notes (Signed)
Patient presents to ED via POV from home. Here with bilateral foot pain. Left > Right. 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

## 2023-02-17 NOTE — ED Provider Notes (Signed)
[place] EMERGENCY DEPARTMENT AT [facility] Provider Note   CSN: [id number] Arrival date & time: 02/17/23  1602     History  Chief Complaint  Patient presents with   Foot Injury    Caleb Mckinney is a 47 y.o. male.   Foot Injury Patient presents with bilateral foot injuries.  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  Has pain on the front part of both his feet but some swelling worse in the left side.  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  Pain with walking.    Past Medical History:  Diagnosis Date   Anxiety    Depression    Diabetes mellitus without complication (HCC)    pt hasnt been formally diagnosised yet but Dr feels he has it. 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 01/27/2022   History of kidney stones    Hypertension    hasnt been formally  dx yet with it but BP's are elevated. 01/27/2022   Shoulder dislocation    Wears glasses     Home Medications Prior to Admission medications   Medication Sig Start Date End Date Taking? Authorizing Provider  oxyCODONE-acetaminophen (PERCOCET/ROXICET) 5-325 MG tablet Take 1-2 tablets by mouth every 8 (eight) hours as needed for severe pain. 02/17/23  Yes Benjiman Core, MD  lisinopril (ZESTRIL) 10 MG tablet Take 1 tablet (10 mg total) by mouth daily. 11/10/22 12/10/22  Arabella Merles, PA-C  metFORMIN (GLUCOPHAGE) 500 MG tablet Take 1 tablet (500 mg total) by mouth 2 (two) times daily with a meal. 11/10/22 12/10/22  Arabella Merles, PA-C      Allergies    Patient has no known allergies.    Review of Systems   Review of Systems  Physical Exam Updated Vital Signs BP (!) 143/105 (BP Location: Left Arm)   Pulse (!) 102   Temp 97.9 F (36.6 C) (Oral)   Resp 16   SpO2 98%  Physical Exam Vitals and nursing note reviewed.  Musculoskeletal:        General:  Tenderness present.     Comments: Tenderness along the dorsum of both feet.  Along the first metatarsal on the left foot there is an ecchymotic raised hematoma.  Able to flex and extend the toes but does have some pain.  Also some pain along the metatarsals of the right foot.  Neurological:     Mental Status: He is alert and oriented to person, place, and time.     ED Results / Procedures / Treatments   Labs (all labs ordered are listed, but only abnormal results are displayed) Labs Reviewed - No data to display  EKG None  Radiology DG Foot Complete Right  Result Date: 02/17/2023 CLINICAL DATA:  Crush injury. Dropped football tackling sled on feet yesterday. Left-greater-than-right foot pain. EXAM: RIGHT FOOT COMPLETE - 3+ VIEW COMPARISON:  None Available. FINDINGS: Severe tibiotalar joint space narrowing, subchondral sclerosis, peripheral osteophytosis, similar to the contralateral foot imaged the same day. Mild posterior dorsal navicular degenerative spurring. Mild great toe metatarsophalangeal joint space narrowing and lateral osteophytosis. No acute fracture or dislocation. IMPRESSION: 1. No acute fracture or dislocation. 2. Severe tibiotalar osteoarthritis. Electronically Signed   By: Neita Garnet M.D.   On: 02/17/2023 17:35   DG Foot Complete Left  Result Date: 02/17/2023 CLINICAL DATA:  Crush injury. Left greater than right foot pain. Dropped tackling sled from football on feet yesterday. Left greater than right foot pain. EXAM: LEFT FOOT - COMPLETE 3+ VIEW COMPARISON:  None Available. FINDINGS: There is moderate dorsal forefoot soft tissue swelling greatest within the level of the metatarsal shafts. Severe tibiotalar joint space narrowing, subchondral sclerosis, and peripheral osteophytosis. No acute fracture or dislocation. IMPRESSION: 1. Moderate dorsal forefoot soft tissue swelling. No acute fracture. 2. Severe tibiotalar osteoarthritis. Electronically Signed   By: Neita Garnet  M.D.   On: 02/17/2023 17:34    Procedures Procedures    Medications Ordered in ED Medications  oxyCODONE-acetaminophen (PERCOCET/ROXICET) 5-325 MG per tablet 1 tablet (1 tablet Oral Given 02/17/23 1742)    ED Course/ Medical Decision Making/ A&P                                 Medical Decision Making Amount and/or Complexity of Data Reviewed Radiology: ordered.  Risk Prescription drug management.     Patient with crush injuries both feet.  Football Psychologist, occupational.  Does have some swelling.  X-rays independently interpreted and reassuring.  Does have reportedly very mild hemophilia.  Do not think this really changes management much.  Will treat symptomatically.  Follow-up with Ortho as needed.        Final Clinical Impression(s) / ED Diagnoses Final diagnoses:  Crushing injury of foot, unspecified laterality, initial encounter    Rx / DC Orders ED Discharge Orders          Ordered    oxyCODONE-acetaminophen (PERCOCET/ROXICET) 5-325 MG tablet  Every 8 hours PRN        02/17/23 1814              Benjiman Core, MD 02/17/23 2339
